# Patient Record
Sex: Female | Born: 1960 | Race: Black or African American | Hispanic: No | Marital: Married | Smoking: Former smoker
Health system: Southern US, Community
[De-identification: ages and names within clinical notes are randomized; demographics above are authoritative.]

## PROBLEM LIST (undated history)

## (undated) DIAGNOSIS — R7303 Prediabetes: Secondary | ICD-10-CM

## (undated) DIAGNOSIS — G43909 Migraine, unspecified, not intractable, without status migrainosus: Secondary | ICD-10-CM

## (undated) DIAGNOSIS — E785 Hyperlipidemia, unspecified: Secondary | ICD-10-CM

## (undated) DIAGNOSIS — E559 Vitamin D deficiency, unspecified: Secondary | ICD-10-CM

## (undated) DIAGNOSIS — E538 Deficiency of other specified B group vitamins: Secondary | ICD-10-CM

## (undated) HISTORY — DX: Deficiency of other specified B group vitamins: E53.8

## (undated) HISTORY — PX: TUBAL LIGATION: SHX77

## (undated) HISTORY — DX: Migraine, unspecified, not intractable, without status migrainosus: G43.909

## (undated) HISTORY — DX: Vitamin D deficiency, unspecified: E55.9

---

## 2012-04-06 ENCOUNTER — Encounter (INDEPENDENT_AMBULATORY_CARE_PROVIDER_SITE_OTHER): Payer: Self-pay

## 2012-04-09 ENCOUNTER — Encounter (INDEPENDENT_AMBULATORY_CARE_PROVIDER_SITE_OTHER): Payer: Self-pay | Admitting: Specialist

## 2012-04-09 ENCOUNTER — Ambulatory Visit (INDEPENDENT_AMBULATORY_CARE_PROVIDER_SITE_OTHER): Payer: BC Managed Care – PPO | Admitting: Specialist

## 2012-04-09 VITALS — BP 112/67 | HR 77 | Wt 126.0 lb

## 2012-04-09 MED ORDER — ZOLMITRIPTAN 5 MG NA SOLN
5.00 mg | NASAL | Status: DC | PRN
Start: 2012-04-09 — End: 2013-03-27

## 2012-04-09 NOTE — Progress Notes (Signed)
ASSESSMENT: PROGRESS NOTE    Patient Name: AudreyVang    CC: Chronic Headaches  Chief Complaint   Patient presents with   . Follow-up     PCP:    Patient presents for follow up of neurologic concerns. She reports migraine headaches triggered with weather changes, as well as erratic menses. Headaches escalated in January (7 days) but December was only 5 days, and she requires refills. She stopped magnesium and estro-balance caused flatulence. Previous trials topomax, neurontin, flexeril, and Inderal unhelpful. CGRP clinical trial was possibly helpful but she had erratic menses as well.           Review of Systems:     Neurological ROS: positive for - headaches  No chest pain, SOB, palpitations, or dizziness.      Physical Exam:  BP 112/67  Pulse 77  Wt 57.153 kg (126 lb)  Neurological - alert, oriented, normal speech, no focal findings or movement disorder noted, neck supple without rigidity, DTR's normal and symmetric, motor and sensory grossly normal bilaterally    Medications:     Current outpatient prescriptions:ZOLMitriptan (ZOMIG) 5 MG nasal solution, by Nasal route as needed., Disp: , Rfl: ;  SUMAtriptan Succinate (SUMAVEL DOSEPRO) 6 MG/0.5ML DEVI, Inject into the skin 2 (two) times daily., Disp: , Rfl:     Past Medical/Surgical History:     Allergies   Allergen Reactions   . Sulfa Antibiotics Swelling     Patient Active Problem List   Diagnosis   . Migraine without aura, with intractable migraine, so stated, without mention of status migrainosus     Past Medical History   Diagnosis Date   . Migraine      Past Surgical History   Procedure Date   . Tubal ligation      History     Social History   . Marital Status: Married     Spouse Name: N/A     Number of Children: N/A   . Years of Education: N/A     Occupational History   . Not on file.     Social History Main Topics   . Smoking status: Former Games developer   . Smokeless tobacco: Never Used   . Alcohol Use: 0.6 oz/week     1 Glasses of wine per week   . Drug Use:  No   . Sexually Active: Yes     Other Topics Concern   . Not on file     Social History Narrative   . No narrative on file         Assessment:     1. Migraine without aura, with intractable migraine, so stated, without mention of status migrainosus        Plan:   Rx refill for zomig NS provided. Reviewed possibility of trying nortriptylene but she would like to defer. She will rechallenge with magnesium oxide.       Total time spent face to face= 25 minutes with >15 minute for counseling and coordination of care of the above assessment, differential diagnosis, treatment options, and management of the above noted plan.       Signed by:  Lonell Face D.O.  Clinical cytogeneticist of Headache & Concussion  [employer]  [facility]

## 2012-04-16 ENCOUNTER — Telehealth (INDEPENDENT_AMBULATORY_CARE_PROVIDER_SITE_OTHER): Payer: Self-pay

## 2012-04-16 NOTE — Telephone Encounter (Signed)
PreAuth was done for Pt, Zomig 5mg . NS its approved and pt was notified.

## 2013-03-27 ENCOUNTER — Ambulatory Visit (INDEPENDENT_AMBULATORY_CARE_PROVIDER_SITE_OTHER): Payer: BC Managed Care – PPO | Admitting: Specialist

## 2013-03-27 ENCOUNTER — Encounter (INDEPENDENT_AMBULATORY_CARE_PROVIDER_SITE_OTHER): Payer: Self-pay | Admitting: Specialist

## 2013-03-27 VITALS — BP 124/78 | HR 82 | Resp 18 | Ht 60.0 in | Wt 125.0 lb

## 2013-03-27 DIAGNOSIS — G43019 Migraine without aura, intractable, without status migrainosus: Secondary | ICD-10-CM

## 2013-03-27 MED ORDER — ZOLMITRIPTAN 5 MG NA SOLN
5.00 mg | NASAL | Status: DC | PRN
Start: 2013-03-27 — End: 2013-05-02

## 2013-03-27 NOTE — Progress Notes (Signed)
Patient Name: [name] [name],[name] [name]    CC: Chronic Headaches, Post-menopausal state  Chief Complaint   Patient presents with   . Follow-up     History of Present Illness:   She had previously magnesium and estro-balance because it caused flatulence but resuming it has been unhelpful. Previous trials topomax, neurontin, flexeril, and Inderal unhelpful. CGRP clinical trial was possibly helpful but she had erratic menses as well. Currently no further menses after D/C and avg headache frequency about 5-8 days per month triggered likely with weather changes. Zomig very helpful but headache recurs and clusters frequently. The location of pain is bifrontal and in posterior occiput, and midline. No suffering if treated. No nausea but occasionally some GI upset.     Review of Systems:     Neurological ROS: positive for - headaches  All other systems were reviewed and are negative    Physical Exam:  BP 124/78  Pulse 82  Resp 18  Ht 1.524 m (5')  Wt 56.7 kg (125 lb)  BMI 24.41 kg/m2    Well nourished and well developed,  Skin warm and well perfused,No rash noted,  Normal dentition, Oropharynx inspection normal,  No Carotid Bruit  Heart regular rate and rhythm without murmur,    Patient is AAO x 3, Normal cognition,   CN II-XII intact ,PERRLA and NL fundoscopy,   NL motor and sensory assessments,  NL DTR's, NL Gait,    Musculoskeletal exam reveals no tenderness on palpation of greater occipital nerves,  No noted upper cervical muscle spasms.  No evidence for atlas asymmetry on palpation.    Medications:     Current outpatient prescriptions:nitrofurantoin, macrocrystal-monohydrate, (MACROBID) 100 MG capsule, Take 100 mg by mouth 2 (two) times daily., Disp: , Rfl: ;  ZOLMitriptan (ZOMIG) 5 MG nasal solution, 1 spray by Nasal route as needed., Disp: 12 mL, Rfl: 11    Past Medical/Surgical History:     Allergies   Allergen Reactions   . Sulfa Antibiotics Swelling     Patient Active Problem List   Diagnosis   . Migraine without aura,  with intractable migraine, so stated, without mention of status migrainosus     Past Medical History   Diagnosis Date   . Migraine      Past Surgical History   Procedure Date   . Tubal ligation      History     Social History   . Marital Status: Married     Spouse Name: N/A     Number of Children: N/A   . Years of Education: N/A     Occupational History   . Not on file.     Social History Main Topics   . Smoking status: Former Games developer   . Smokeless tobacco: Never Used   . Alcohol Use: 0.6 oz/week     1 Glasses of wine per week   . Drug Use: No   . Sexually Active: Yes     Other Topics Concern   . Not on file     Social History Narrative   . No narrative on file         Family History:     Family History   Problem Relation Age of Onset   . Arthritis Mother    . Cancer Father    . Cancer Maternal Grandfather        Assessment:     1. Migraine without aura, with intractable migraine, so stated, without mention of status migrainosus  2. Post-menopausal state.     Plan:     Rx refill for zomig provided . She will avoid repetitive dosing to prevent rebound. She will try addition of melatonin in interim if sleep not optimal.       Total time spent face to face= 25 minutes with >15 minute for counseling and coordination of care of the above assessment, differential diagnosis, treatment options, and management of the above noted plan.     Signed by:  Lonell Face D.O., F.A.H.S.  Clinical cytogeneticist of Headache & Concussion  [facility]  [facility]   [phone]

## 2013-05-02 ENCOUNTER — Other Ambulatory Visit (INDEPENDENT_AMBULATORY_CARE_PROVIDER_SITE_OTHER): Payer: Self-pay

## 2013-05-02 MED ORDER — ZOLMITRIPTAN 5 MG NA SOLN
5.0000 mg | NASAL | Status: DC | PRN
Start: 2013-05-02 — End: 2014-04-28

## 2014-04-28 ENCOUNTER — Encounter (INDEPENDENT_AMBULATORY_CARE_PROVIDER_SITE_OTHER): Payer: Self-pay | Admitting: Physician Assistant

## 2014-04-28 ENCOUNTER — Ambulatory Visit (INDEPENDENT_AMBULATORY_CARE_PROVIDER_SITE_OTHER): Payer: BC Managed Care – PPO | Admitting: Physician Assistant

## 2014-04-28 VITALS — BP 130/79 | HR 84 | Ht 60.0 in | Wt 127.0 lb

## 2014-04-28 DIAGNOSIS — R51 Headache: Secondary | ICD-10-CM

## 2014-04-28 DIAGNOSIS — F32A Depression, unspecified: Secondary | ICD-10-CM

## 2014-04-28 DIAGNOSIS — F329 Major depressive disorder, single episode, unspecified: Secondary | ICD-10-CM

## 2014-04-28 DIAGNOSIS — R519 Headache, unspecified: Secondary | ICD-10-CM

## 2014-04-28 MED ORDER — ZOLMITRIPTAN 5 MG NA SOLN
5.0000 mg | NASAL | Status: DC | PRN
Start: 2014-04-28 — End: 2015-04-29

## 2014-04-28 NOTE — Progress Notes (Signed)
Have you sought care outside of [facility] since we last saw you?   Yes - GYN

## 2014-04-28 NOTE — Progress Notes (Signed)
Still with weather changes as a major trigger. Zomig NS helps abort the pain completely, however can cause some burning. She is premenopausal and finds that the headaches have improved. 5-8 headache a month. She continues to get mild upset stomach symptoms with her headaches, and light/sound sensitivity and sinus symptoms, ear pain with her headache pain. Lately they have been localizing to the middle of her brows, and behind her left ear. No longer getting them at the base of her skull.     Sleep: Finds issues with both sleep onset and maintenance. It takes 30+ minutes to fall asleep, and waking up throughout the night due to hot flashes. She restarted Melatonin 4mg  and now started Zzzquil without much improvement.     Mood: Depressed, undure if this is seasonal or due to menopause. She finds winter time to be more depressing for her.           Allergies   Allergen Reactions   . Sulfa Antibiotics Swelling     Current Outpatient Prescriptions on File Prior to Visit   Medication Sig Dispense Refill   . nitrofurantoin, macrocrystal-monohydrate, (MACROBID) 100 MG capsule Take 100 mg by mouth 2 (two) times daily.     Marland Kitchen ZOLMitriptan (ZOMIG) 5 MG nasal solution 1 spray by Nasal route every 2 (two) hours as needed. 36 mL 3     No current facility-administered medications on file prior to visit.         Review of Systems :  Review of Systems   Constitutional: Negative for weight loss, malaise/fatigue and diaphoresis.   HENT: Negative for congestion and nosebleeds.    Eyes: Positive for photophobia.   Respiratory: Negative for hemoptysis, sputum production, shortness of breath and stridor.    Cardiovascular: Negative for orthopnea, claudication and leg swelling.   Gastrointestinal: Positive for nausea.   Genitourinary: Negative for urgency, frequency and hematuria.   Musculoskeletal: Negative for back pain, joint pain and neck pain.   Neurological: Positive for headaches. Negative for speech change, focal weakness and  seizures.   Psychiatric/Behavioral: Positive for depression. The patient has insomnia.            Physical Examination:   Filed Vitals:    04/28/14 0913   BP: 130/79   Pulse: 84       The patient was well developed, well nourished with normal hydration, and was cooperative, calm, followed commands and had a normal affect.    The patient was oriented to person, place, and time. Recent and remote memory were normal. Attention span, concentration, and language function were normal. There was no evidence of aphasia.      Fund of knowledge confirms that patient is aware of the current president and is able to spell CAT.     Cranial nerves:  Pupils are equal, round, regular and react to light;  III, IV, and VI revealed no abnormality. The patient had full extraocular movements. There was no nystagmus. The seventh cranial nerve function appeared normal. No facial weakness or asymmetry was noted. No evidence of central or peripheral seventh nerve palsy. In conversational speech, the patient's hearing appeared normal.     The patient moves all four extremities equally. Motor strength is 5/5 in bilat UE and LE.     There was no tremor, no spontaneous abnormal movements.  Coordination revealed no abnormality.     Station and gait were normal. Arm swing was symmetric. No balance abnormality.  Assessment:  Patient Active Problem List   Diagnosis   . Migraine without aura, with intractable migraine, so stated, without mention of status migrainosus         Plan:  [name] [name] is a 54 y.o. female with a Past Medical History of Chronic Episodic Migraines  -Continue Zomig PRN  -Discussed possible ani-depressants, she would like to hold off at the moment, will discuss her issues with GYN see if they are hormonal related and then get back to [place]  -Discussed possible prescription sleep agents, she would like to increase Melatonin at this time and will see if her sleep improves, if not then she will consider rx strength  medication.  -Follow up in 1 year or sooner PRN

## 2014-10-18 ENCOUNTER — Emergency Department (HOSPITAL_COMMUNITY)
Admission: EM | Admit: 2014-10-18 | Discharge: 2014-10-18 | Disposition: A | Discharge status: Home / Self Care | Payer: BLUE CROSS/BLUE SHIELD | Attending: Emergency Medicine | Admitting: Emergency Medicine

## 2014-10-18 ENCOUNTER — Emergency Department (HOSPITAL_COMMUNITY): Payer: BLUE CROSS/BLUE SHIELD

## 2014-10-18 ENCOUNTER — Encounter (HOSPITAL_COMMUNITY): Payer: Self-pay

## 2014-10-18 DIAGNOSIS — G43909 Migraine, unspecified, not intractable, without status migrainosus: Secondary | ICD-10-CM | POA: Insufficient documentation

## 2014-10-18 DIAGNOSIS — R0602 Shortness of breath: Secondary | ICD-10-CM | POA: Insufficient documentation

## 2014-10-18 DIAGNOSIS — Z87891 Personal history of nicotine dependence: Secondary | ICD-10-CM | POA: Diagnosis not present

## 2014-10-18 DIAGNOSIS — R202 Paresthesia of skin: Secondary | ICD-10-CM | POA: Insufficient documentation

## 2014-10-18 DIAGNOSIS — R531 Weakness: Secondary | ICD-10-CM | POA: Diagnosis present

## 2014-10-18 DIAGNOSIS — Z8639 Personal history of other endocrine, nutritional and metabolic disease: Secondary | ICD-10-CM | POA: Diagnosis not present

## 2014-10-18 DIAGNOSIS — R109 Unspecified abdominal pain: Secondary | ICD-10-CM | POA: Diagnosis not present

## 2014-10-18 DIAGNOSIS — R002 Palpitations: Secondary | ICD-10-CM | POA: Insufficient documentation

## 2014-10-18 HISTORY — DX: Prediabetes: R73.03

## 2014-10-18 HISTORY — DX: Migraine, unspecified, not intractable, without status migrainosus: G43.909

## 2014-10-18 HISTORY — DX: Hyperlipidemia, unspecified: E78.5

## 2014-10-18 LAB — I-STAT CHEM 8, ED
BUN: 19 mg/dL (ref 6–20)
CALCIUM ION: 1.22 mmol/L (ref 1.12–1.23)
Chloride: 106 mmol/L (ref 101–111)
Creatinine, Ser: 0.8 mg/dL (ref 0.44–1.00)
GLUCOSE: 86 mg/dL (ref 65–99)
HCT: 42 % (ref 36.0–46.0)
HEMOGLOBIN: 14.3 g/dL (ref 12.0–15.0)
Potassium: 4.1 mmol/L (ref 3.5–5.1)
Sodium: 143 mmol/L (ref 135–145)
TCO2: 24 mmol/L (ref 0–100)

## 2014-10-18 LAB — I-STAT TROPONIN, ED: Troponin i, poc: 0 ng/mL (ref 0.00–0.08)

## 2014-10-18 NOTE — ED Notes (Signed)
Pt reports she had a migraine last night and took her prescription meds. Pt then noticed heart was "racing." Pt denies chest pain. Pt also reports left side weakness which is common for her after a migraine.

## 2014-10-18 NOTE — ED Provider Notes (Signed)
CSN: [id number]     Arrival date & time 10/18/14  0746 History   First MD Initiated Contact with Patient 10/18/14 0759     Chief Complaint  Patient presents with  . Palpitations  . Extremity Weakness     (Consider location/radiation/quality/duration/timing/severity/associated sxs/prior Treatment) HPI   54 y.o female complaining of migraine headache begasn last night with left sided h/a symptoms which are improved after using zomig this am although some slight residual h/a.   This was like her usual migraine.  Began last night.  Arm felt weak this am which has been part of her migraines in the past.  She states she was concerned due to palpitations which she describes as pounding in the chest but no pain.  She was very active moving daughter in to aartment yesterday.  Palpitatiojs are currently mild.   Past Medical History  Diagnosis Date  . Migraines   . Pre-diabetes   . Hyperlipidemia    Past Surgical History  Procedure Laterality Date  . Tubal ligation     History reviewed. No pertinent family history. Social History  Substance Use Topics  . Smoking status: Former Smoker    Quit date: 03/08/1987  . Smokeless tobacco: None  . Alcohol Use: Yes     Comment: Occasionally    OB History    No data available     Review of Systems  Constitutional: Positive for activity change.  Eyes: Negative.   Respiratory: Positive for shortness of breath.   Cardiovascular: Positive for palpitations.  Gastrointestinal:       Abdominal cramping resolved with bm  Endocrine: Negative.   Genitourinary: Negative.   Musculoskeletal: Negative.   Skin: Negative.   Allergic/Immunologic: Negative.   Neurological: Positive for weakness.  Hematological: Negative.   Psychiatric/Behavioral: Negative.   All other systems reviewed and are negative.     Allergies  Sulfa antibiotics  Home Medications   Prior to Admission medications   Not on File   BP 122/68 mmHg  Pulse 71  Temp(Src)  97.9 F (36.6 C) (Oral)  Resp 14  Ht 5' (1.524 m)  Wt 125 lb (56.7 kg)  BMI 24.41 kg/m2  SpO2 100% Physical Exam  Constitutional: She is oriented to person, place, and time. She appears well-developed and well-nourished. No distress.  HENT:  Head: Normocephalic and atraumatic.  Right Ear: External ear normal.  Left Ear: External ear normal.  Nose: Nose normal.  Mouth/Throat: Oropharynx is clear and moist.  Eyes: Conjunctivae and EOM are normal. Pupils are equal, round, and reactive to light.  Neck: Normal range of motion. Neck supple.  Cardiovascular: Normal rate, regular rhythm, normal heart sounds and intact distal pulses.   Pulmonary/Chest: Effort normal and breath sounds normal.  Abdominal: Soft. Bowel sounds are normal.  Musculoskeletal: Normal range of motion.  Neurological: She is alert and oriented to person, place, and time. She has normal reflexes. She displays normal reflexes. No cranial nerve deficit. She exhibits normal muscle tone. Coordination normal.  Skin: Skin is warm and dry.  Psychiatric: She has a normal mood and affect. Her behavior is normal. Judgment and thought content normal.  Nursing note and vitals reviewed.   ED Course  Procedures (including critical care time) Labs Review Labs Reviewed  I-STAT CHEM 8, ED  Rosezena Sensor, ED    Imaging Review Dg Chest 2 View  10/18/2014   CLINICAL DATA:  Palpitations, dyspnea and headache.  EXAM: CHEST - 2 VIEW  COMPARISON:  None  FINDINGS: The  heart size and mediastinal contours are within normal limits. There is no evidence of pulmonary edema, consolidation, pneumothorax, nodule or pleural fluid. The visualized skeletal structures are unremarkable.  IMPRESSION: No active disease.   Electronically Signed   By: Irish Lack M.D.   On: 10/18/2014 09:06   Ct Head Wo Contrast  10/18/2014   CLINICAL DATA:  Left arm weakness, migraine headache.  EXAM: CT HEAD WITHOUT CONTRAST  TECHNIQUE: Contiguous axial images  were obtained from the base of the skull through the vertex without intravenous contrast.  COMPARISON:  None.  FINDINGS: Bony calvarium appears intact. No mass effect or midline shift is noted. Ventricular size is within normal limits. There is no evidence of mass lesion, hemorrhage or acute infarction.  IMPRESSION: Normal head CT.   Electronically Signed   By: Lupita Raider, M.D.   On: 10/18/2014 09:06   I, Sherolyn Trettin S, personally reviewed and evaluated these images and lab results as part of my medical decision-making.   EKG Interpretation   Date/Time:  Saturday October 18 2014 08:04:35 EDT Ventricular Rate:  77 PR Interval:  155 QRS Duration: 87 QT Interval:  369 QTC Calculation: 418 R Axis:   53 Text Interpretation:  Normal sinus rhythm Normal ECG Confirmed by Josyah Achor MD,  Duwayne Heck ([id number]) on 10/18/2014 8:29:01 AM      MDM   Final diagnoses:  Migraine without status migrainosus, not intractable, unspecified migraine type  Paresthesia of left arm  Palpitations        Margarita Grizzle, MD 10/18/14 1155

## 2014-10-18 NOTE — Discharge Instructions (Signed)
Your evaluation here including physical exam, EKG, and labs was normal. Please follow-up with your primary care doctor this week. Return here if you are worse at any time and we will reevaluate you.   Palpitations A palpitation is the feeling that your heartbeat is irregular. It may feel like your heart is fluttering or skipping a beat. It may also feel like your heart is beating faster than normal. This is usually not a serious problem. In some cases, you may need more medical tests. HOME CARE  Avoid:  Caffeine in coffee, tea, soft drinks, diet pills, and energy drinks.  Chocolate.  Alcohol.  Stop smoking if you smoke.  Reduce your stress and anxiety. Try:  A method that measures bodily functions so you can learn to control them (biofeedback).  Yoga.  Meditation.  Physical activity such as swimming, jogging, or walking.  Get plenty of rest and sleep. GET HELP IF:  Your fast or irregular heartbeat continues after 24 hours.  Your palpitations occur more often. GET HELP RIGHT AWAY IF:   You have chest pain.  You feel short of breath.  You have a very bad headache.  You feel dizzy or pass out (faint). MAKE SURE YOU:   Understand these instructions.  Will watch your condition.  Will get help right away if you are not doing well or get worse. Document Released: 12/01/2007 Document Revised: 07/08/2013 Document Reviewed: 04/22/2011 [facility] Patient Information 2015 [place], [place]. This information is not intended to replace advice given to you by your health care provider. Make sure you discuss any questions you have with your health care provider.

## 2015-04-29 ENCOUNTER — Ambulatory Visit (INDEPENDENT_AMBULATORY_CARE_PROVIDER_SITE_OTHER): Payer: BC Managed Care – PPO | Admitting: Medical

## 2015-04-29 ENCOUNTER — Encounter (INDEPENDENT_AMBULATORY_CARE_PROVIDER_SITE_OTHER): Payer: Self-pay | Admitting: Acute Care

## 2015-04-29 ENCOUNTER — Ambulatory Visit (INDEPENDENT_AMBULATORY_CARE_PROVIDER_SITE_OTHER): Payer: BC Managed Care – PPO | Admitting: Acute Care

## 2015-04-29 VITALS — BP 131/83 | HR 71 | Ht 60.0 in | Wt 123.0 lb

## 2015-04-29 DIAGNOSIS — G43809 Other migraine, not intractable, without status migrainosus: Secondary | ICD-10-CM

## 2015-04-29 MED ORDER — NARATRIPTAN HCL 2.5 MG PO TABS
2.5000 mg | ORAL_TABLET | ORAL | Status: DC | PRN
Start: 2015-04-29 — End: 2015-09-24

## 2015-04-29 NOTE — Progress Notes (Signed)
S:  [name] [name] is a 55 y.o. female presents for follow up.  Migraine, 6 per month.  Zomig NS works for relief, however she experiences terrible side effects so she would like to try a new Triptan.  Left sided, throbbing/pulsating, worse with activity.  Retro-orbital, behind her ear, wrapping around to the back of her head in a band-like formation.  Phono/photobia, some upset stomach, no vomiting.    Sleeping well with Melatonin.    Previous trials topomax, neurontin, flexeril, and Inderal, all ineffective.    ROS:  Headaches  All other systems reviewed and are negative.    O: BP 131/83 mmHg  Pulse 71  Ht 1.524 m (5')  Wt 55.792 kg (123 lb)  BMI 24.02 kg/m2    Awake, alert, oriented x 3, speech clear and fluent  PERRL, EOMI, FS, TML  Following commands  Hearing intact  No drift  Finger to nose normal  No tremors  Strength 5/5 throughout  Reflexes 2+ throughout  Sensation intact to light touch  Station normal, Gait stable    A:   Patient Active Problem List    Diagnosis Date Noted   . Migraine without aura, with intractable migraine, so stated, without mention of status migrainosus 04/09/2012       P:  Amerge for rescue.  Stop Zomig NS.  Plain Mucinex for stuffy nose/cold symptoms.    Consider preventative at next OV.    Start Magnesium Oxide 400mg , Riboflavin (Vitamin B2) 400mg  and Coenzyme Q10 300mg  daily for headache prevention     Follow up:  [place] 3 months.    Discussed the risks associated with long-term use of controlled substances/sedating/narcotic/opioid medication.  Discussed the dangers of withdrawal, and that if withdrawals occur to proceed to the closest ER or call 911. Discussed the importance of using the medication only as prescribed, for if the patient runs out of medication early, no early fills will be issued.  No new prescriptions will be reissued for lost or stolen prescriptions.    Patient advised that prescriptions will be filled at appointments only and not routinely in between  appointments.  Patient verbalized understanding.    The patient is in agreement with this plan and verbalized understanding of these instructions.    Kelvin Cellar, NP

## 2015-04-29 NOTE — Patient Instructions (Signed)
Start Magnesium Oxide 400mg , Riboflavin (Vitamin B2) 400mg  and Coenzyme Q10 300mg  daily for headache prevention

## 2015-05-28 ENCOUNTER — Ambulatory Visit (INDEPENDENT_AMBULATORY_CARE_PROVIDER_SITE_OTHER): Payer: BC Managed Care – PPO | Admitting: Neurology

## 2015-05-28 VITALS — BP 130/79 | HR 89 | Ht 60.0 in | Wt 124.0 lb

## 2015-05-28 DIAGNOSIS — G43019 Migraine without aura, intractable, without status migrainosus: Secondary | ICD-10-CM

## 2015-05-28 NOTE — Progress Notes (Signed)
[facility] Established Follow up     55 y.o. right handed handed female presents with headaches, former Dr. Renold Don patient, last seen on 04/28/14 by  for migraine headaches. Medical chart reviewed. [name] [name] began at age 40. HA start on posterior neck or behind either the right or left forehead and radiate to temple and neck. The headaches are described as sometimes dull, sometimes sharp, sometimes throbbing. There is no aura.    HA have improved since menopause  Is now on average having 6 HA days per month on average, sometimes headaches improve with aleve, Last 1 - 2 hours,       Associated symptoms: Has photo/phono/osmo phobia, no nausea, no vomiting, blurry vision, fatigue  Autonomic symptoms: Denies ptosis, conjunctival injection, lacrimation, rhinorrhea, congestion, perioral numbness, pallor   Neurological symptoms: Denies focal numbness, weakness, vertigo, lightheadedness, diplopia, dysarthria or aphasia     Triggers: weather, wine  Headaches are treated with zomig, tried amerge recently     Prior work-up:  MRI in the past were negative     Prior meds:  Topamax  Gabapentin  Propranolol     Flexeril  Zomig  Imitrex    Past Medical History   Diagnosis Date   . Migraine        Current outpatient prescriptions:   .  co-enzyme Q-10 50 MG capsule, Take 100 mg by mouth daily., Disp: , Rfl:   .  naratriptan (AMERGE) 2.5 MG tablet, Take 1 tablet (2.5 mg total) by mouth as needed for Migraine (Repeat in 4 hours if needed.  Max 2 in 24 hours.)., Disp: 12 tablet, Rfl: 5  Allergies   Allergen Reactions   . Sulfa Antibiotics Swelling     Social History     Social History   . Marital Status: Married     Spouse Name: N/A   . Number of Children: N/A   . Years of Education: N/A     Occupational History   . Not on file.     Social History Main Topics   . Smoking status: Former Games developer   . Smokeless tobacco: Never Used   . Alcohol Use: 0.6 oz/week     1 Glasses of wine per week   . Drug Use: No   . Sexual Activity: Yes      Other Topics Concern   . Not on file     Social History Narrative     Family History   Problem Relation Age of Onset   . Arthritis Mother    . Cancer Father    . Cancer Maternal Grandfather        Lifestyle: has not been exercising, sleeps 6 hours with a regular schedule, eats breakfast, drinks 4 cups of fluids and 1 cups of caffeine    Review of Systems:  General: patient denies fever, chills    Head and neck: patient denies sore throat, blurry vision  Pulmonary: patient denies cough, SOB  Cardiovascular/cholesterol: patient denies chest pain, palpitations   Neurologic: See HPI   GI: patient denies nausea, vomiting, diarrhea or constipation  GU: patient denies dysuria  Endocrine: patient denies heat/cold intolerance   Rheum: patient denies joint pain  Heme/Onc: patient denies easy bruising  Derm/bone: patient denies rashes  Psychiatric: patient denies anxiety or depression     PE:  Filed Vitals:    05/28/15 1014   BP: 130/79   Pulse: 89   Height: 1.524 m (5')   Weight: 56.246 kg (124 lb)  HEENT: NCAT  Heart: RRR, no bruits  Resp: CTAB  Abd: NTND  Neuro:  Mental status: normal cognition, fluent, psychological good affect  Cranial nerves: PERRL, EOMI, VFF, sens intact to LT, face symmetric,  hearing intact to voice, tongue/uvula/palate midline, good shoulder shrug and scm strength  Motor: no pronator drift, normal tone and bulk, 5/5  Sensory: intact to light touch on all 4 extremities   DTR: 2+ throughout  Coor: intact FTN and HTS   Gait: narrow, normal stance, good arm swing     Impression:  55 y.o. female with   1. Intractable migraine without aura and without status migrainosus       - She is having on average 6 headache days per month, explained at length that not starting prevention places her at increased risk for chronic migraine  - Ok to hold off on prevention while she works on lifestyle changes and takes supplements on a daily basis  - If on follow up visit in 3 months has more than 4 HA days per  month, will plan to start candesartan  - Continue amerge for abortive, counseled to limit use to 2 days/week  - continue Magnesium 400mg , Riboflavin (Vitamin B2) 400mg  and Coenzyme Q10 300mg  daily for headache prevention     Lifestyle modifications: increase exercise, keep a regular sleep schedule, have breakfast with protein, increase hydration;  - RTC in 3 months with NP Morris, subsequent appointment with MD    Greater than 23 minutes of this 45 minute visit was spent on education and counseling regarding diagnosis and treatment including lifestyle modifications, proper use of preventive and abortive medication and avoidance of medication overuse.

## 2015-05-28 NOTE — Patient Instructions (Signed)
1. Instructions were provided regarding your diagnosis and treatment plan.     2. Lifestyle modifications were reviewed: exercise regularly, keep a regular sleep schedule, eat breakfast with protein, drink plenty of fluids.     3. Remember to limit acute headache treatment medicines (like triptans or anti-inflammatories) to 2 days/week to avoid medication-overuse headache.    For migraine education resources:    American Headache Society: https://americanmigrainefoundation.org  American Migraine Foundation: https://americanmigrainefoundation.org    Start Magnesium citrate or glycinate 400mg , Riboflavin (Vitamin B2) 400mg  and Coenzyme Q10 300mg  daily for headache prevention

## 2015-08-12 ENCOUNTER — Ambulatory Visit (INDEPENDENT_AMBULATORY_CARE_PROVIDER_SITE_OTHER): Payer: BC Managed Care – PPO | Admitting: Neurology

## 2015-09-24 ENCOUNTER — Ambulatory Visit (INDEPENDENT_AMBULATORY_CARE_PROVIDER_SITE_OTHER): Payer: BC Managed Care – PPO | Admitting: Neurology

## 2015-09-24 VITALS — BP 118/73 | HR 86 | Resp 18 | Ht 60.0 in | Wt 124.0 lb

## 2015-09-24 DIAGNOSIS — G43019 Migraine without aura, intractable, without status migrainosus: Secondary | ICD-10-CM

## 2015-09-24 MED ORDER — NARATRIPTAN HCL 2.5 MG PO TABS
2.5000 mg | ORAL_TABLET | ORAL | Status: AC | PRN
Start: 2015-09-24 — End: 2015-10-24

## 2015-09-24 NOTE — Progress Notes (Signed)
[name] [name],[name] [name]  02-Jan-1961  [id number]    [facility] Follow UP    55 y.o. female last seen on 05/28/15 for migraine headaches.    Has been treating headaches with Aleve     Has not needed a triptan since June 11  In May had 6 migraines   Now headaches are milder     Started supplements and noticed headaches are improved.     HA start on posterior neck or behind either the right or left forehead and radiate to temple and neck. The headaches are described as sometimes dull, sometimes sharp, sometimes throbbing. Associated photo/phono/osmo phobia, blurry vision, fatigue      Current outpatient prescriptions:   .  co-enzyme Q-10 50 MG capsule, Take 100 mg by mouth daily., Disp: , Rfl:   .  Cyanocobalamin (VITAMIN B 12 PO), Take by mouth., Disp: , Rfl:   .  ESTRACE VAGINAL 0.1 MG/GM vaginal cream, 1 GRAM TWICE WEEKLY AND SMALL AMOUNT WITH FINGER AROUND OPENING FOR 4 WEEKS, Disp: , Rfl: 3  .  Magnesium 500 MG Tab, Take 500 mg by mouth., Disp: , Rfl:   .  naratriptan (AMERGE) 2.5 MG tablet, Take 1 tablet (2.5 mg total) by mouth as needed for Migraine.Max 2 in 24 hours. Limit use to 2 days per week., Disp: 12 tablet, Rfl: 6      PRIOR MEDS:  Topamax  Gabapentin  Propranolol   Flexeril  Zomig  Imitrex    ROS:  exercise 3-4 days, sleep 5 - 6 hours, eats breakfast, drinks 4 cups of fluids   Negative for visual changes, rash, bleeding abnormalities, cardiac, GI, respiratory issues    PE:  Filed Vitals:    09/24/15 1535   BP: 118/73   Pulse: 86   Resp: 18   Height: 1.524 m (5')   Weight: 56.246 kg (124 lb)       GEN: no acute distress  CV: RR  MS: fluent with no aphasia   CN: PERRL, EOMI, VFF, face symmetric, T midline  M: no PND, normal tone, 5/5 throughout   Gait: narrow with good armswing    IMP:  55 y.o.female with   1. Intractable migraine without aura and without status migrainosus       - Has had significant decrease in HA frequency in June  - Continue supplements  - Continue amerge for rescue    - Lifestyle:  continue exercise, keep a regular sleep schedule, eat breakfast with protein, increase hydration  - RTC in 4 months     Greater than 15 minutes of this 30 minute encounter was spent on education and counseling regarding diagnosis and treatment with the patient  including lifestyle modifications, proper use of preventive and abortive medication and avoidance of medication overuse.

## 2015-09-24 NOTE — Patient Instructions (Signed)
1. Instructions were provided regarding your diagnosis and treatment plan.     2. Lifestyle modifications were reviewed: exercise regularly, keep a regular sleep schedule, eat breakfast with protein, drink plenty of fluids.     3. Remember to limit acute headache treatment medicines (like triptans or anti-inflammatories) to 2 days/week to avoid medication-overuse headache.    For migraine education resources:    American Headache Society: https://americanheadachesociety.org  American Migraine Foundation: https://americanmigrainefoundation.org

## 2015-12-28 ENCOUNTER — Ambulatory Visit (INDEPENDENT_AMBULATORY_CARE_PROVIDER_SITE_OTHER): Payer: BC Managed Care – PPO | Admitting: Neurology

## 2015-12-28 ENCOUNTER — Encounter (INDEPENDENT_AMBULATORY_CARE_PROVIDER_SITE_OTHER): Payer: Self-pay

## 2015-12-28 ENCOUNTER — Encounter (INDEPENDENT_AMBULATORY_CARE_PROVIDER_SITE_OTHER): Payer: Self-pay | Admitting: Neurology

## 2015-12-28 VITALS — BP 112/73 | HR 79 | Ht 60.0 in | Wt 124.0 lb

## 2015-12-28 DIAGNOSIS — M5410 Radiculopathy, site unspecified: Secondary | ICD-10-CM

## 2015-12-28 DIAGNOSIS — M792 Neuralgia and neuritis, unspecified: Secondary | ICD-10-CM

## 2015-12-28 DIAGNOSIS — G6289 Other specified polyneuropathies: Secondary | ICD-10-CM

## 2015-12-28 DIAGNOSIS — G43109 Migraine with aura, not intractable, without status migrainosus: Secondary | ICD-10-CM

## 2015-12-28 MED ORDER — GABAPENTIN 300 MG PO CAPS
300.0000 mg | ORAL_CAPSULE | Freq: Every evening | ORAL | 11 refills | Status: DC | PRN
Start: 2015-12-28 — End: 2016-11-14

## 2015-12-28 NOTE — Patient Instructions (Signed)
MRI:   - [facility]  - [facility]  - [facility] - [address], [place], [place]    EMG/NCS - [employer] - Any [facility] facility

## 2015-12-28 NOTE — Progress Notes (Signed)
CC: neuropathic pain    HPI:   History was obtained from patient,    55 y.o. year-old female who sees Dr Arturo Morton for migraines. Migraines occur roughly 4-5x a month. She is not on a migraine preventive, declining it. Takes migraine preventive vitamins. They are associated with photophobia, photopsias, and occasional tingling.     She is seeing me because she is experiencing bilateral burning leg pain that is progressively worsening. No clear exacerbating or alleviating factors. She also experiences neck pain radiating down her arms at time. She does not have any motor difficulties or falls. She has never had a workup for this pain syndrome. She has never had imaging done.     Previous records were reviewed and, in summary, reveal:   - migraines reducing in frequency post menopause. Rx'd only migraine vitamins which she takes.    Past Medical, Surgical, Social, Family History: Per Epic. Reviewed and updated  She works in Education officer, environmental, work involves lots of typing.    Review of Systems   Constitutional: Negative for fever.   HENT: Negative for hearing loss.    Eyes: Positive for photophobia.   Respiratory: Negative for shortness of breath.    Cardiovascular: Negative for chest pain.   Gastrointestinal: Negative for abdominal pain.   Genitourinary: Negative for urgency.   Musculoskeletal: Positive for back pain, myalgias and neck pain. Negative for falls and joint pain.   Skin: Negative for rash.   Neurological: Positive for tingling, sensory change and headaches. Negative for focal weakness, loss of consciousness and weakness.   Endo/Heme/Allergies: Does not bruise/bleed easily.   Psychiatric/Behavioral: Negative for substance abuse.   All other systems reviewed and are negative.      Meds:  Current Outpatient Prescriptions on File Prior to Visit   Medication Sig Dispense Refill   . co-enzyme Q-10 50 MG capsule Take 100 mg by mouth daily.     . Cyanocobalamin (VITAMIN B 12 PO) Take by mouth.     . ESTRACE VAGINAL 0.1  MG/GM vaginal cream 1 GRAM TWICE WEEKLY AND SMALL AMOUNT WITH FINGER AROUND OPENING FOR 4 WEEKS  3   . Magnesium 500 MG Tab Take 500 mg by mouth.       No current facility-administered medications on file prior to visit.        EXAM:  Vitals:    12/28/15 1014   BP: 112/73   Pulse: 79     Afebrile.  Respirations normal.    General: Well developed and well nourished in no acute distress. There is no icterus or cyanosis or pedal edema. Lungs are clear and heart sounds are normal. Peripheral pulses are intact.    Mental Status: The patient is oriented to person, place, and time. Attention and concentration are normal. There is no gross aphasia present. Recent and remote memory were normal. Speech is tangential.    Cranial Nerves:   I: deferred  II: Visual fields normal  III / VI / VI: Extraocular movements full.   V: Muscle of mastication symmetrically strong and sensation normal in all 3 divisions bilaterally to pinprick  VII: Symmetric face  VIII: Hearing intact to voice  IX, X: Phonation intact  XI: SCM/Traps intact  XII: Tongue midline    Motor: Power 5/5 throughout. normal tone and bulk.    Sensory: decreased pinprick in bilateral median n distributions as well as radial nerve distribution on right. Decreased pinprick in a length depedent manner in legs to 1/4th the way up the  shin. In addition, extra pinprick loss in bilateral great toes.    Reflexes: Reflexes are symmetrically 2+ in arms and 1+ patellars and absent achilles.    Cerebellar: no finger-nose, heel-toe or truncal ataxia noted. No tremors noted.     Gait / Station: Romberg negative. Gait revealed normal stance, stride, arm swing.     Imaging ordered    Labs ordered    Testing: Relevant neurodiagnostic testing orders    ASSESSMENT / PLAN  I spent 60 min of face-to-face time, of which 45 min was spent counseling. Issues addressed are noted in HPI section as well as below:  1. Migraines. Declning migraine preventive. Gabapentin will help with  migraines.    2. Neuropathic pain. Gbp 300mg  qhs PRN. Does not want a daily medication.    3. Peripheral neuropathy - labs ordered for etiologic workup    4. Multiple radiculopathies and/or mononeuropathies. Sensory > Motor. EMG/NCS to fully delineate the syndrome as it appears there are overlapping mechanisms.     1. Neuropathic pain  EMG    Nerve conduction test    MRI Cervical Spine WO Contrast    MRI Lumbar Spine WO Contrast    Nerve conduction test    ANA, BY MULTIPLEX FLOW IMMUNOASSAY WITH REFLEX TO TITER/CASCADE    Comprehensive metabolic panel    Ceruloplasmin    Copper, serum    T4, Total    TSH    Rheumatoid factor    Sedimentation rate (ESR)    C Reactive Protein    Hemoglobin A1C    Vitamin B12    Vitamin D,25 OH, Total    Heavy metals screen, urine    Zinc    Thyroid peroxidase antibody    Anti-Thyroid Antibodies    Thyroid stimulating immunoglobulin   2. Radiculopathy, unspecified spinal region  EMG    Nerve conduction test    MRI Cervical Spine WO Contrast    MRI Lumbar Spine WO Contrast    Nerve conduction test    ANA, BY MULTIPLEX FLOW IMMUNOASSAY WITH REFLEX TO TITER/CASCADE    Comprehensive metabolic panel    Ceruloplasmin    Copper, serum    T4, Total    TSH    Rheumatoid factor    Sedimentation rate (ESR)    C Reactive Protein    Hemoglobin A1C    Vitamin B12    Vitamin D,25 OH, Total    Heavy metals screen, urine    Zinc    Thyroid peroxidase antibody    Anti-Thyroid Antibodies    Thyroid stimulating immunoglobulin   3. Other polyneuropathy  EMG    Nerve conduction test    MRI Cervical Spine WO Contrast    MRI Lumbar Spine WO Contrast    Nerve conduction test    ANA, BY MULTIPLEX FLOW IMMUNOASSAY WITH REFLEX TO TITER/CASCADE    Comprehensive metabolic panel    Ceruloplasmin    Copper, serum    T4, Total    TSH    Rheumatoid factor    Sedimentation rate (ESR)    C Reactive Protein    Hemoglobin A1C    Vitamin B12    Vitamin D,25 OH, Total    Heavy metals screen, urine    Zinc    Thyroid  peroxidase antibody    Anti-Thyroid Antibodies    Thyroid stimulating immunoglobulin   4. Migraine with aura and without status migrainosus, not intractable  EMG    Nerve conduction test    MRI Cervical  Spine WO Contrast    MRI Lumbar Spine WO Contrast    Nerve conduction test    ANA, BY MULTIPLEX FLOW IMMUNOASSAY WITH REFLEX TO TITER/CASCADE    Comprehensive metabolic panel    Ceruloplasmin    Copper, serum    T4, Total    TSH    Rheumatoid factor    Sedimentation rate (ESR)    C Reactive Protein    Hemoglobin A1C    Vitamin B12    Vitamin D,25 OH, Total    Heavy metals screen, urine    Zinc    Thyroid peroxidase antibody    Anti-Thyroid Antibodies    Thyroid stimulating immunoglobulin       Orders Placed This Encounter   Procedures   . MRI Cervical Spine WO Contrast   . MRI Lumbar Spine WO Contrast   . ANA, BY MULTIPLEX FLOW IMMUNOASSAY WITH REFLEX TO TITER/CASCADE   . Comprehensive metabolic panel   . Ceruloplasmin   . Copper, serum   . T4, Total   . TSH   . Rheumatoid factor   . Sedimentation rate (ESR)   . C Reactive Protein   . Hemoglobin A1C   . Vitamin B12   . Vitamin D,25 OH, Total   . Heavy metals screen, urine   . Zinc   . Thyroid peroxidase antibody   . Anti-Thyroid Antibodies   . Thyroid stimulating immunoglobulin   . EMG   . Nerve conduction test   . Nerve conduction test       Yes if checked below:    _x__ This condition poses a threat to life, limb or cognitive function with a disease course that is modifiable with treatment.     Follow up in 1 month with me.    This patient is new to me    Please contact me with any questions. Patients and [facility] Providers can reach me via MyChart.    Dazia Lippold H. Theodoro Grist, MD, PhD  [facility]    [place]: [phone]  [place] [facility]: [phone]  [facility]: [phone]

## 2016-02-08 ENCOUNTER — Ambulatory Visit (INDEPENDENT_AMBULATORY_CARE_PROVIDER_SITE_OTHER): Payer: BC Managed Care – PPO | Admitting: Neurology

## 2016-03-10 ENCOUNTER — Encounter (INDEPENDENT_AMBULATORY_CARE_PROVIDER_SITE_OTHER): Payer: Self-pay | Admitting: Neurology

## 2016-03-27 ENCOUNTER — Other Ambulatory Visit (INDEPENDENT_AMBULATORY_CARE_PROVIDER_SITE_OTHER): Payer: Self-pay | Admitting: Acute Care

## 2016-04-14 ENCOUNTER — Encounter (INDEPENDENT_AMBULATORY_CARE_PROVIDER_SITE_OTHER): Payer: BC Managed Care – PPO | Admitting: Neurology

## 2016-04-14 NOTE — Progress Notes (Signed)
Patient late for appointment unable to be seen

## 2016-04-14 NOTE — Progress Notes (Deleted)
[name],[name]  09-13-1960  [id number]    [facility] Follow UP    56 y.o. female last seen on 09/24/15 for ***.      - Has had significant decrease in HA frequency in June  - Continue supplements  - Continue amerge for rescue    - Lifestyle: continue exercise, keep a regular sleep schedule, eat breakfast with protein, increase hydration  - RTC in 4 months     56 y.o. female last seen on 05/28/15 for migraine headaches.    Has been treating headaches with Aleve     Has not needed a triptan since June 11  In May had 6 migraines   Now headaches are milder     Started supplements and noticed headaches are improved.     HA start on posterior neck or behind either the right or left forehead and radiate to temple and neck. The headaches are described as sometimes dull, sometimes sharp, sometimes throbbing. Associated photo/phono/osmo phobia, blurry vision, fatigue        Current Outpatient Prescriptions:   .  co-enzyme Q-10 50 MG capsule, Take 100 mg by mouth daily., Disp: , Rfl:   .  Cyanocobalamin (VITAMIN B 12 PO), Take by mouth., Disp: , Rfl:   .  ESTRACE VAGINAL 0.1 MG/GM vaginal cream, 1 GRAM TWICE WEEKLY AND SMALL AMOUNT WITH FINGER AROUND OPENING FOR 4 WEEKS, Disp: , Rfl: 3  .  gabapentin (NEURONTIN) 300 MG capsule, Take 1 capsule (300 mg total) by mouth nightly as needed (tingling pain)., Disp: 30 capsule, Rfl: 11  .  Magnesium 500 MG Tab, Take 500 mg by mouth., Disp: , Rfl:       PRIOR MEDS:  Topamax  Gabapentin  Propranolol   Flexeril  Zomig  Imitrex      ROS:  exercise ***, sleep ***, *** breakfast, drinks *** cups of fluids   Negative for visual changes, rash, bleeding abnormalities, cardiac, GI, respiratory issues    PE:  There were no vitals filed for this visit.    GEN: no acute distress  CV: RRR  MS: fluent with no aphasia   CN: PERRL, disks sharp, EOMI, VFF, face symmetric, T midline  M: no PND, normal tone, 5/5 throughout   S: intact LT on all 4 ext  DTR: 2+ throughout   Coor: intact FNF  and HTS  Gait: narrow with good armswing    IMP:  56 y.o.female with No diagnosis found.    - Lifestyle: *** exercise, keep a regular sleep schedule, eat breakfast with protein, *** hydration  - RTC     Greater than 15 minutes of this 30 minute visit was spent on education and counseling regarding diagnosis of *** and treatment that includes medication and lifestyle modifications that include exercise 4 days per week, increase non-caffeinated fluid intake to 64 ounces per day, eat regular meals and breakfast with protein, limit caffeine intake to 200mg  per day and sleep 7-8 hour per night; proper use of the preventive medication which is *** and the abortive medication which is ***. Also limit use of rescue medications to 8 days per month to avoid medication overuse.

## 2016-04-20 ENCOUNTER — Ambulatory Visit (INDEPENDENT_AMBULATORY_CARE_PROVIDER_SITE_OTHER): Payer: BC Managed Care – PPO | Admitting: Neurology

## 2016-04-20 DIAGNOSIS — G43109 Migraine with aura, not intractable, without status migrainosus: Secondary | ICD-10-CM

## 2016-04-20 DIAGNOSIS — M5410 Radiculopathy, site unspecified: Secondary | ICD-10-CM

## 2016-04-20 DIAGNOSIS — G629 Polyneuropathy, unspecified: Secondary | ICD-10-CM

## 2016-04-20 DIAGNOSIS — G6289 Other specified polyneuropathies: Secondary | ICD-10-CM

## 2016-04-20 DIAGNOSIS — M792 Neuralgia and neuritis, unspecified: Secondary | ICD-10-CM

## 2016-04-20 NOTE — Procedures (Signed)
History:  This is a 56 year-old woman with symptoms of polyneuropathy    Physical Exam:  On examination, strength is full in the right/left/bilateral upper/lower/right/left extremities. Deep tendon reflexes are bilaterally symmetrical. Sensation is intact to touch, pinprick, except right lower extremity, decreased pinprick sensation     Findings:   1. The right/left median palmar, ulnar palmar, radial and sural sensory responses were unremarkable.  2. The right/left median and ulnar; common peroneal and tibial motor responses were unremarkable.   3. The right/left median and ulnar; common peroneal and tibial F-wave latencies were unremarkable.   4.   Needle electromyography of five muscles of the right/left upper/lower extremity was unremarkable.      Impression: This is a normal study. There is no electrodiagnostic evidence suggestive of a generalized large fiber neuropathy, myopathy, or right/left cervical/lumbosacral radiculopathy on this study.   Clinical and radiological correlation is recommended.however, small fiber polyneuropathy cannot be ruled out with normal EMG      Rosalio Macadamia, MD - [place]  NEUROLOGY  Available on [facility] paging  Board Certified in Neurology by ABPN  Board Certified Clinical Neurophysiology by ABPN     [address] [place]  [place] [id number]  T [phone] F [phone]     [email or web address]

## 2016-04-20 NOTE — Procedures (Signed)
No note

## 2016-04-20 NOTE — Procedures (Signed)
EMG/NCV studies are complete  Studies were scanned    Filed NCV charges

## 2016-04-25 ENCOUNTER — Ambulatory Visit
Admission: RE | Admit: 2016-04-25 | Discharge: 2016-04-25 | Disposition: A | Discharge status: Home / Self Care | Payer: BC Managed Care – PPO | Source: Ambulatory Visit | Attending: Neurology | Admitting: Neurology

## 2016-04-25 DIAGNOSIS — M4722 Other spondylosis with radiculopathy, cervical region: Secondary | ICD-10-CM | POA: Insufficient documentation

## 2016-04-25 DIAGNOSIS — G43109 Migraine with aura, not intractable, without status migrainosus: Secondary | ICD-10-CM

## 2016-04-25 DIAGNOSIS — M792 Neuralgia and neuritis, unspecified: Secondary | ICD-10-CM

## 2016-04-25 DIAGNOSIS — G6289 Other specified polyneuropathies: Secondary | ICD-10-CM | POA: Insufficient documentation

## 2016-04-25 DIAGNOSIS — M5410 Radiculopathy, site unspecified: Secondary | ICD-10-CM

## 2016-04-25 DIAGNOSIS — R937 Abnormal findings on diagnostic imaging of other parts of musculoskeletal system: Secondary | ICD-10-CM | POA: Insufficient documentation

## 2016-04-25 DIAGNOSIS — M4726 Other spondylosis with radiculopathy, lumbar region: Secondary | ICD-10-CM | POA: Insufficient documentation

## 2016-04-25 DIAGNOSIS — M9953 Intervertebral disc stenosis of neural canal of lumbar region: Secondary | ICD-10-CM | POA: Insufficient documentation

## 2016-04-25 DIAGNOSIS — K7689 Other specified diseases of liver: Secondary | ICD-10-CM | POA: Insufficient documentation

## 2016-04-25 DIAGNOSIS — N858 Other specified noninflammatory disorders of uterus: Secondary | ICD-10-CM | POA: Insufficient documentation

## 2016-05-02 ENCOUNTER — Ambulatory Visit (INDEPENDENT_AMBULATORY_CARE_PROVIDER_SITE_OTHER): Payer: BC Managed Care – PPO | Admitting: Neurology

## 2016-05-02 VITALS — BP 129/79 | HR 89 | Ht 60.0 in | Wt 124.0 lb

## 2016-05-02 DIAGNOSIS — E559 Vitamin D deficiency, unspecified: Secondary | ICD-10-CM

## 2016-05-02 DIAGNOSIS — F5101 Primary insomnia: Secondary | ICD-10-CM | POA: Insufficient documentation

## 2016-05-02 DIAGNOSIS — E538 Deficiency of other specified B group vitamins: Secondary | ICD-10-CM

## 2016-05-02 DIAGNOSIS — G43019 Migraine without aura, intractable, without status migrainosus: Secondary | ICD-10-CM

## 2016-05-02 DIAGNOSIS — G629 Polyneuropathy, unspecified: Secondary | ICD-10-CM

## 2016-05-02 MED ORDER — NARATRIPTAN HCL 2.5 MG PO TABS
5.0000 mg | ORAL_TABLET | ORAL | 11 refills | Status: DC | PRN
Start: 2016-05-02 — End: 2016-07-22

## 2016-05-02 MED ORDER — VITAMIN D (ERGOCALCIFEROL) 1.25 MG (50000 UT) PO CAPS
50000.0000 [IU] | ORAL_CAPSULE | ORAL | 4 refills | Status: DC
Start: 2016-05-02 — End: 2017-03-16

## 2016-05-02 NOTE — Patient Instructions (Signed)
These vitamins, when taken on a daily basis, can help to prevent headaches. For mild headaches, vitamins alone may be enough -- but they certainly have a helpful effect even if you are on a migraine prevention medicine.    Magnesium Oxide 400mg twice a day  Coenzyme Q10 (CoQ10) 100mg twice a day  Riboflavin 100mg twice a day  Vitamin B12 1000 mcg once a day    Do not take abortives more than 3 times a week combined, as these can worsen headaches even with moderate use. Examples are Tylenol, Motrin, Excedrin Migraine, Ibuprofin, Alleve, Sumatriptan, Fioricet, BC Powder or Tramadol. Avoid the use of opiate containing medications as much as possible (Percocet, Norco, Dilauded, Morphine, etc).

## 2016-05-02 NOTE — Progress Notes (Signed)
CC: headache; tingling    HPI:   History was obtained from patient     56 y.o. year-old female who continues to have migraines. She is not on preventive therapy but does take migraine vitamins. She uses Amerge 7 days a month with partial relief (naratriptan 2.5 mg each time). Her headaches are moderate intensity, frontal, last half a day, triggered by lights & sounds & smells. Associated with dizziness, but not a visual aura. She gets headaches 7 days/month. She occasionally gets Dizzy without a headache (last time was 2 weeks ago) that requires her to pull the car over.    Her parasthesias are somewhat improved spontaneously. She never tried the gabapentin. Triggered by lying on her back, causing compression on left side, resulting in radiating pain down the left leg. It occurs most times she lies down. However, while moderately painful, does not interfere with her quality of life.     Past Medical, Surgical, Social, Family History: Per Epic. Reviewed and updated. Also see scanned in sheet that accompanies this record (which was reviewed prior to scanning)    Med / Surg -   Past Medical History:   Diagnosis Date   . Migraine        Soc-   Social History     Social History   . Marital status: Married     Spouse name: N/A   . Number of children: N/A   . Years of education: N/A     Occupational History   . Not on file.     Social History Main Topics   . Smoking status: Former Games developer   . Smokeless tobacco: Never Used   . Alcohol use 0.6 oz/week     1 Glasses of wine per week   . Drug use: No   . Sexual activity: Yes     Other Topics Concern   . Not on file     Social History Narrative   . No narrative on file     Fam-   Family History   Problem Relation Age of Onset   . Arthritis Mother    . Cancer Father    . Cancer Maternal Grandfather          Review of Systems   Constitutional: Negative.    HENT: Negative.    Eyes: Negative.    Respiratory: Negative.    Cardiovascular: Negative.    Gastrointestinal: Negative.     Genitourinary: Negative.    Musculoskeletal: Positive for back pain, joint pain and neck pain.   Skin: Negative.    Neurological: Positive for tingling and headaches.   Endo/Heme/Allergies: Negative.    Psychiatric/Behavioral: The patient has insomnia.      All other systems reviewed and negative    Meds:  Current Outpatient Prescriptions on File Prior to Visit   Medication Sig Dispense Refill   . co-enzyme Q-10 50 MG capsule Take 100 mg by mouth daily.     . Cyanocobalamin (VITAMIN B 12 PO) Take by mouth.     Andrey Campanile VAGINAL 0.1 MG/GM vaginal cream 1 GRAM TWICE WEEKLY AND SMALL AMOUNT WITH FINGER AROUND OPENING FOR 4 WEEKS  3   . gabapentin (NEURONTIN) 300 MG capsule Take 1 capsule (300 mg total) by mouth nightly as needed (tingling pain). 30 capsule 11   . Magnesium 500 MG Tab Take 500 mg by mouth.       No current facility-administered medications on file prior to visit.  EXAM:  Visit Vitals  BP 129/79 (BP Site: Right arm, Patient Position: Sitting, Cuff Size: Medium)   Pulse 89   Ht 1.524 m (5')   Wt 56.2 kg (124 lb)   BMI 24.22 kg/m     Respiratory rate normal    General: Well developed and well nourished in no acute distress. There is no icterus or cyanosis or pedal edema. Peripheral pulses are intact.   Heart S1 S2 RRR  Lungs clear no wheezes  Abdomen soft, not tender  Skin: no rashes on extremities or joint swelling  Neck: not tender, normal ROM    Psychiatric. Cooperative. Thought content and process normal. Mood good. Affect congruent.       Mental Status: The patient was awake, alert, appeared oriented and was appropriate. Speech was fluent and the patient followed commands well. Attention, concentration, and memory appeared intact. Fund of knowledge appeared appropriate for education level.     Cranial Nerves: Pupils were equally round and reactive to light bilaterally. Extraocular movements were intact without nystagmus. Hearing was intact to conversational speech. Face was symmetric. Tongue  appeared midline.     Motor: At least 4+/5 throughout. Bulk appeared normal for body habitus. No obvious tremor noted.    Reflex: 2+ thoughout    Sensation: light touch was grossly intact.     Coordination: Finger to nose testing was intact without dysmetria.     Gait: Normal and steady.    Imaging reports were reviewed, and images were personally reviewed. It shows MRI C&L spine with disc disease but only mild foraminal narrowing at worst.    Labs were reviewed:   ANA panel normal  A1c, thyroid ok  B12 moderate low, below goal of 500  Vit D = 16, low.     Testing: normal EMG/NCS    ASSESSMENT / PLAN  Follow up in 3 months with me.    1. 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 For mild headaches, vitamins alone may be enough -- but they certainly have a helpful effect even if you are on a migraine prevention medicine.    Magnesium Oxide 400mg  twice a day  Coenzyme Q10 (CoQ10) 100mg  twice a day  Riboflavin 100mg  twice a day  Vitamin B12 1000 mcg once a day    Do not take abortives more than 3 times a week combined, as these can worsen headaches even with moderate use. Examples are Tylenol, Motrin, Excedrin Migraine, Ibuprofin, Alleve, Sumatriptan, Fioricet, BC Powder or Tramadol. Avoid the use of opiate containing medications as much as possible (Percocet, Norco, Dilauded, Morphine, etc).         2. Polyneuropathy  Mild compression neuropathy exacerbated by low vit B12 and low D levels  - gabapentin 300mg  qhs will help\  Physical therapy, massage, heat pad all will help out  Pharmacotherapy not indicated     3. Vitamin D deficiency  Rx 50,000 units a week. Vit D3. Recheck level before next visit   4. Vitamin B12  deficiency  Recommend daily. Recheck level before next visit   5. Insomnia. Gabapentin will help    Orders Placed This Encounter   Procedures   .  Vitamin D,25 OH, Total   . Vitamin B12       Yes if checked below:    X This patient requires treatment with a drug regiment that mandates intensive monitoring for efficacy and side effects. Gabapentin, amerge    Please contact me with any questions. Patients and [place] Providers can reach me via MyChart.    Kyrene Longan H. Theodoro Grist, MD, PhD  [facility]    [place]: [phone]  [place]: [phone]  [facility]: [phone]

## 2016-05-03 ENCOUNTER — Encounter (INDEPENDENT_AMBULATORY_CARE_PROVIDER_SITE_OTHER): Payer: Self-pay | Admitting: Neurology

## 2016-05-11 IMAGING — DX DG CHEST 2V
2 series · 2 of 2 positions shown · non-contrast
Comparison: None

CLINICAL DATA: Palpitations, dyspnea and headache.

EXAM:
CHEST - 2 VIEW

[chest pa]
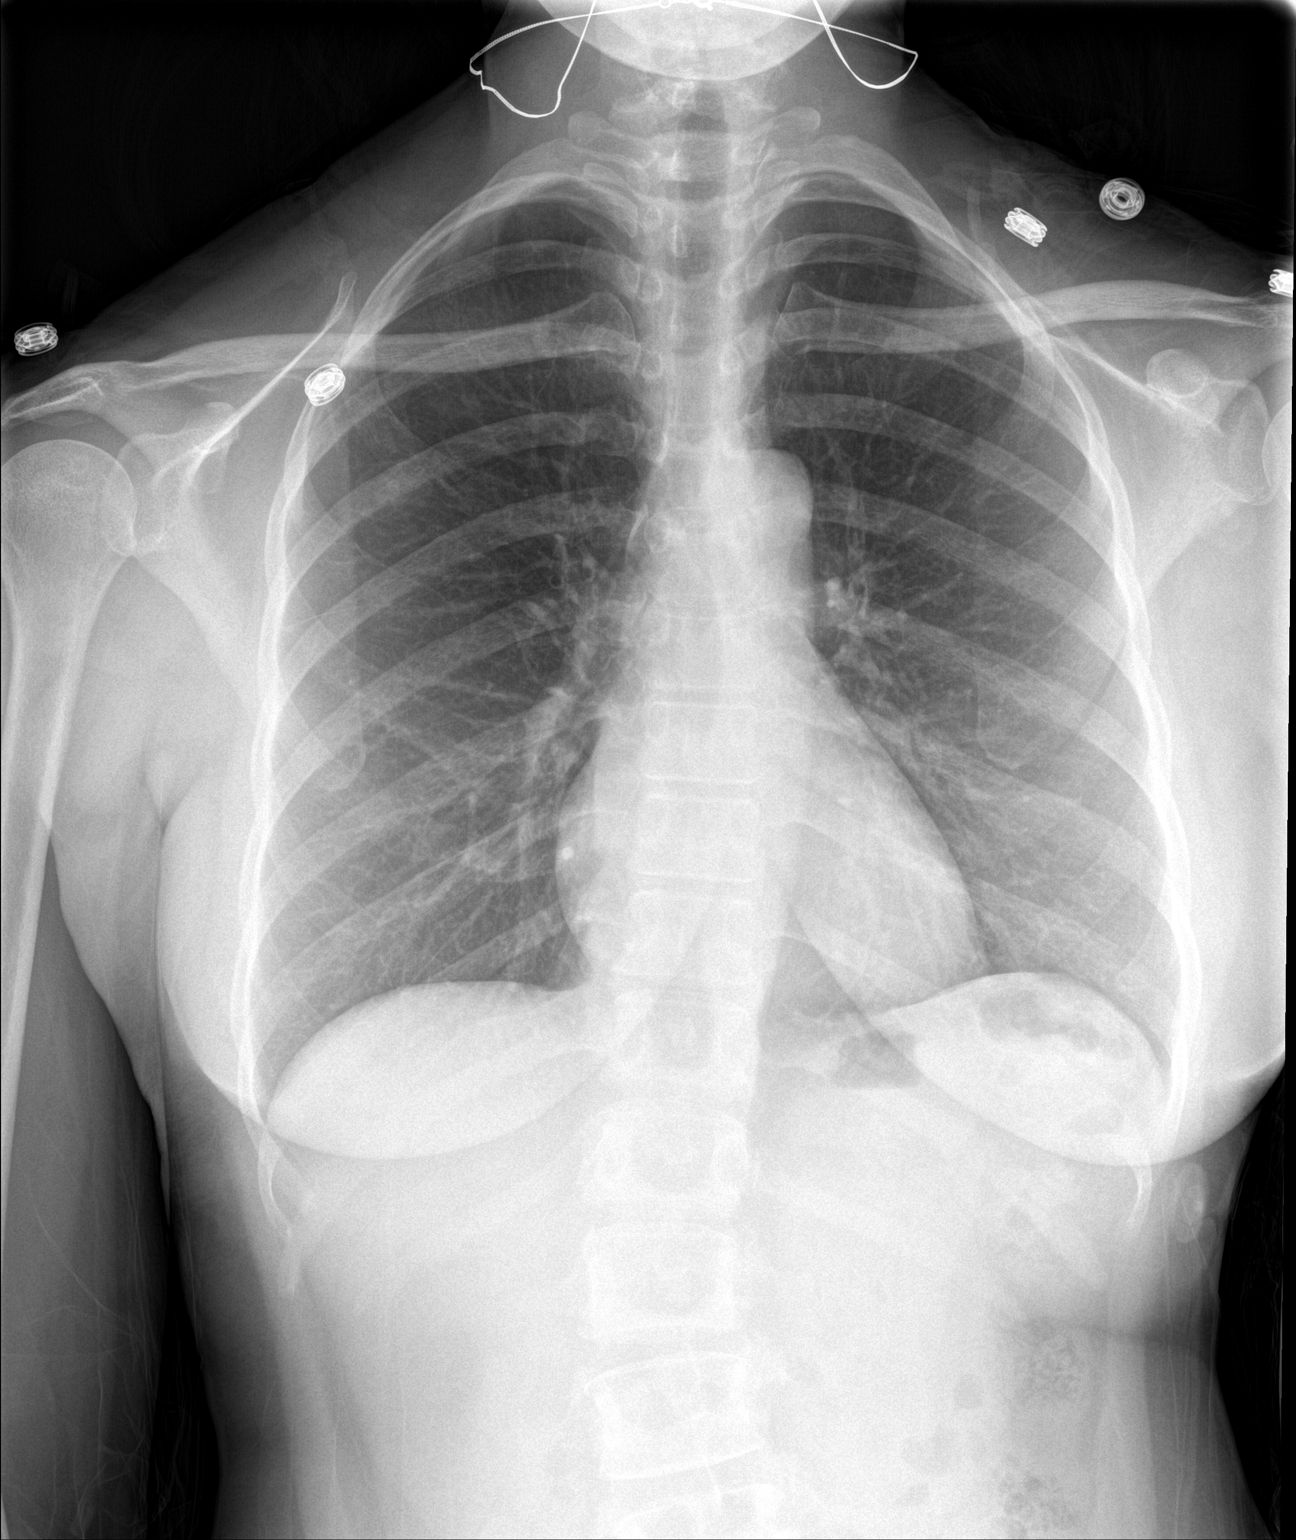

[chest lat]
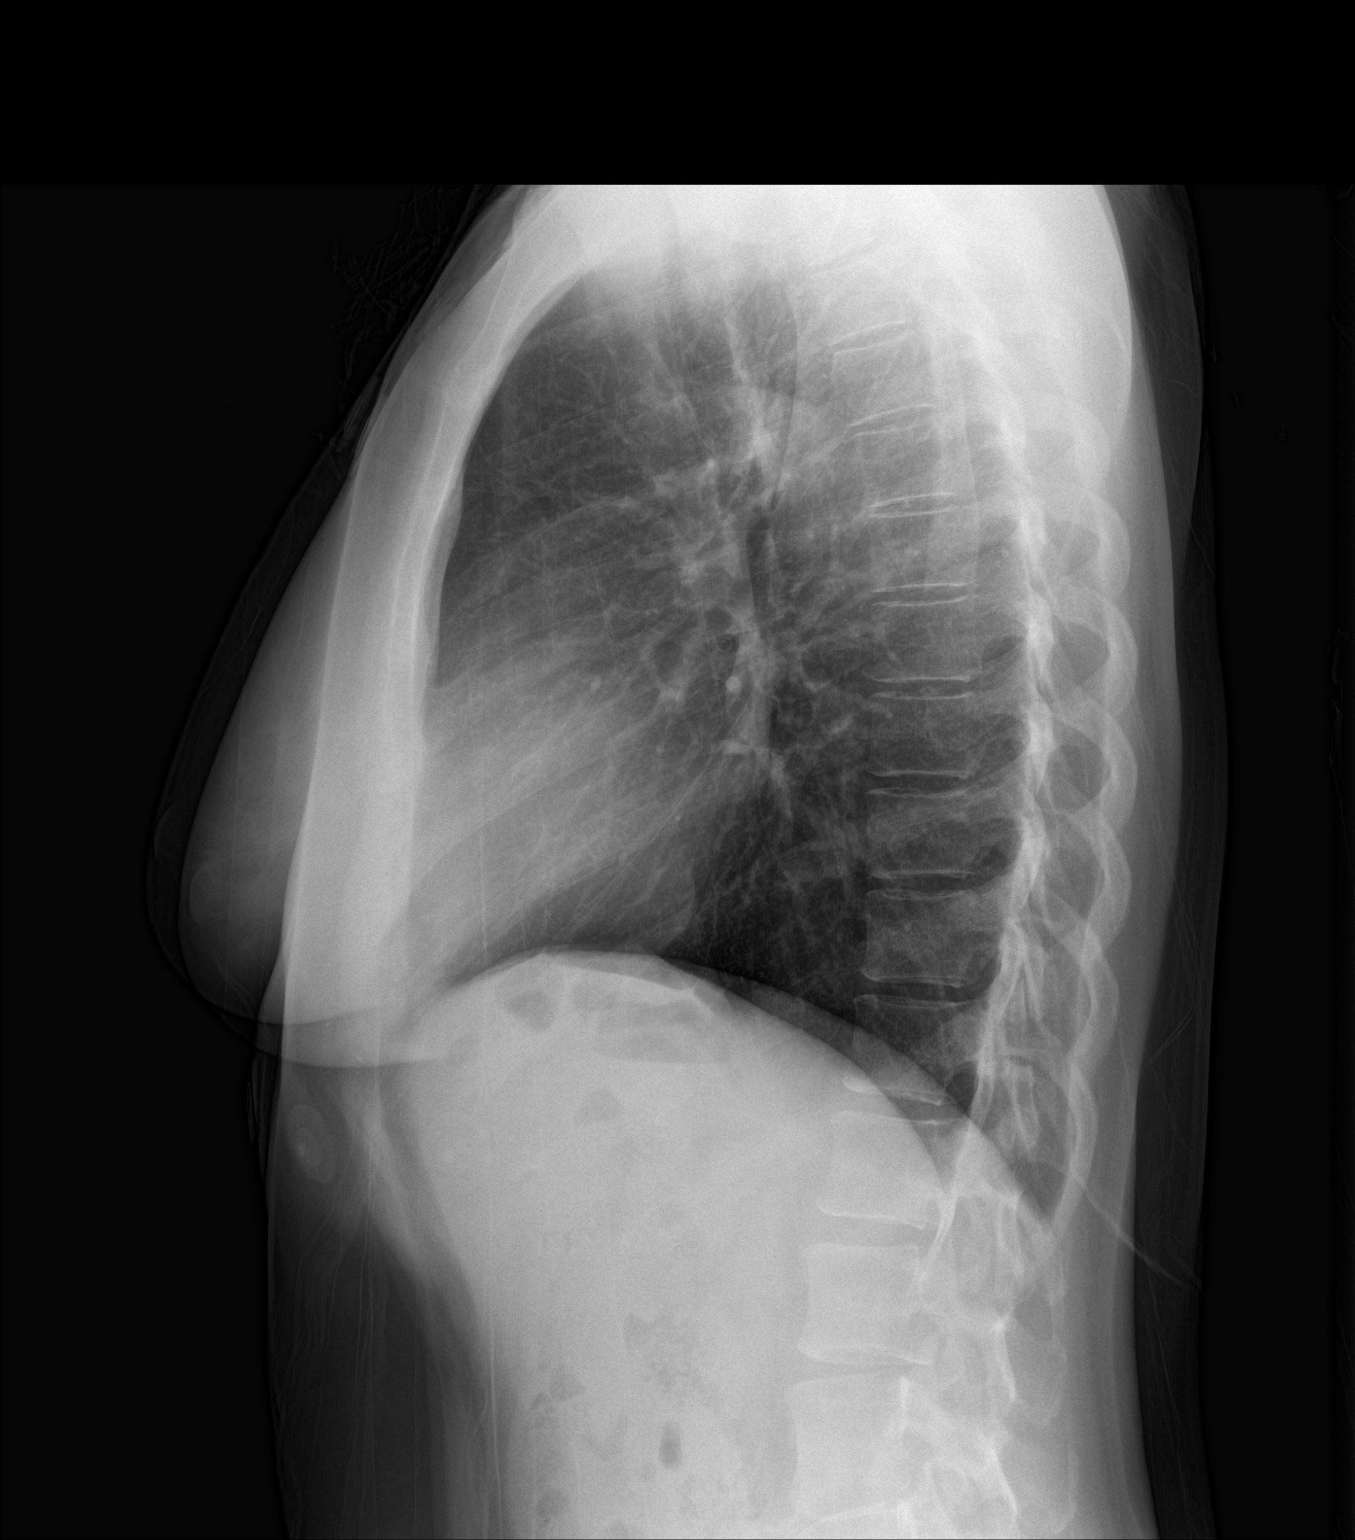

[2 of 2 positions shown; findings below may reference images not displayed]

FINDINGS: The heart size and mediastinal contours are within normal limits.
There is no evidence of pulmonary edema, consolidation,
pneumothorax, nodule or pleural fluid. The visualized skeletal
structures are unremarkable.
IMPRESSION: No active disease.

## 2016-07-22 ENCOUNTER — Ambulatory Visit (INDEPENDENT_AMBULATORY_CARE_PROVIDER_SITE_OTHER): Payer: BC Managed Care – PPO | Admitting: Neurology

## 2016-07-22 ENCOUNTER — Encounter (INDEPENDENT_AMBULATORY_CARE_PROVIDER_SITE_OTHER): Payer: Self-pay | Admitting: Neurology

## 2016-07-22 VITALS — BP 113/76 | HR 87

## 2016-07-22 DIAGNOSIS — E559 Vitamin D deficiency, unspecified: Secondary | ICD-10-CM

## 2016-07-22 DIAGNOSIS — G43019 Migraine without aura, intractable, without status migrainosus: Secondary | ICD-10-CM

## 2016-07-22 DIAGNOSIS — E538 Deficiency of other specified B group vitamins: Secondary | ICD-10-CM

## 2016-07-22 MED ORDER — VITAMIN B-12 1000 MCG PO TABS
1000.0000 ug | ORAL_TABLET | Freq: Every day | ORAL | 11 refills | Status: DC
Start: 2016-07-22 — End: 2020-08-17

## 2016-07-22 MED ORDER — COQ10 100 MG PO CAPS
100.0000 mg | ORAL_CAPSULE | Freq: Two times a day (BID) | ORAL | 11 refills | Status: DC
Start: 2016-07-22 — End: 2018-01-24

## 2016-07-22 MED ORDER — MAGNESIUM OXIDE 400 MG PO TABS
400.0000 mg | ORAL_TABLET | Freq: Two times a day (BID) | ORAL | 11 refills | Status: DC
Start: 2016-07-22 — End: 2019-07-05

## 2016-07-22 MED ORDER — RIBOFLAVIN 100 MG PO TABS
100.0000 mg | ORAL_TABLET | Freq: Two times a day (BID) | ORAL | 11 refills | Status: DC
Start: 2016-07-22 — End: 2019-07-05

## 2016-07-22 MED ORDER — NARATRIPTAN HCL 2.5 MG PO TABS
5.0000 mg | ORAL_TABLET | ORAL | 11 refills | Status: DC | PRN
Start: 2016-07-22 — End: 2017-09-20

## 2016-07-22 NOTE — Patient Instructions (Signed)
Take every day to prevent migraines:  Magnesium Oxide 400mg  twice a day  Coenzyme Q10 (CoQ10) 100mg  twice a day  Riboflavin 100mg  twice a day (may need to be special ordered or from [employer])  Vitamin B12 1000 mcg once a day    Do not take abortives more than 2-3 days a week combined, as these can cause rebound headaches. These include over-the-counter pain meds, triptans. Tramadol & narcotics worsen headaches if used even once a week.    Take gabapentin (300mg ) every night    Use 2 amerge pills at start of a headache. If needed, can combine with 1-2 Excedrin Migraine pills.

## 2016-07-22 NOTE — Progress Notes (Signed)
CC: migraines    HPI:   History was obtained from patient     56 y.o. year-old female with ongoing migraines. 7 days of headaches a month. 2-3 headaches, each lasting 2-3 days. Severity is moderate. Worse with bright lights & loud sounds. Of note, she is not taking gabapentin consistently. She takes vitamins but not exactly as recommended.     Past Medical, Surgical, Social, Family History: Per Epic. Reviewed and updated. Also see scanned in sheet that accompanies this record (which was reviewed prior to scanning)    Med / Surg -   Past Medical History:   Diagnosis Date   . Migraine      Soc-   Social History     Social History   . Marital status: Married     Spouse name: N/A   . Number of children: N/A   . Years of education: N/A     Occupational History   . Not on file.     Social History Main Topics   . Smoking status: Former Games developer   . Smokeless tobacco: Never Used   . Alcohol use 0.6 oz/week     1 Glasses of wine per week   . Drug use: No   . Sexual activity: Yes     Other Topics Concern   . Not on file     Social History Narrative   . No narrative on file     Fam- Noncontributory  Family History   Problem Relation Age of Onset   . Arthritis Mother    . Cancer Father    . Cancer Maternal Grandfather        Review of Systems   Constitutional: Positive for malaise/fatigue.   HENT: Negative.    Eyes: Positive for photophobia.   Respiratory: Negative.    Cardiovascular: Negative.    Gastrointestinal: Negative.    Genitourinary: Negative.    Musculoskeletal: Negative.  Negative for neck pain.   Skin: Negative.    Neurological: Positive for dizziness and headaches.   Endo/Heme/Allergies: Negative.    Psychiatric/Behavioral: Negative.    All other systems reviewed and are negative.    All other systems reviewed and negative    Meds:  Current Outpatient Prescriptions on File Prior to Visit   Medication Sig Dispense Refill   . co-enzyme Q-10 50 MG capsule Take 100 mg by mouth daily.     . Cyanocobalamin (VITAMIN B 12  PO) Take by mouth.     Andrey Campanile VAGINAL 0.1 MG/GM vaginal cream 1 GRAM TWICE WEEKLY AND SMALL AMOUNT WITH FINGER AROUND OPENING FOR 4 WEEKS  3   . gabapentin (NEURONTIN) 300 MG capsule Take 1 capsule (300 mg total) by mouth nightly as needed (tingling pain). 30 capsule 11   . Magnesium 500 MG Tab Take 500 mg by mouth.     . vitamin D, ergocalciferol, (DRISDOL) 50000 UNIT Cap Take 1 capsule (50,000 Units total) by mouth once a week. 10 capsule 4   . [DISCONTINUED] naratriptan (AMERGE) 2.5 MG tablet Take 2 tablets (5 mg total) by mouth as needed for Migraine.MAX 2 days a week 20 tablet 11     No current facility-administered medications on file prior to visit.        EXAM:  Visit Vitals  BP 113/76   Pulse 87     Respiratory rate normal    General: Well developed and well nourished in no acute distress. There is no icterus or cyanosis or pedal edema.  Peripheral pulses are intact.   Heart S1 S2 RRR  Lungs clear no wheezes  Abdomen soft, not tender  Skin: no rashes on extremities or joint swelling  Neck: not tender, normal ROM    Psychiatric. Cooperative. Thought content and process normal. Mood good. Affect congruent.     Mental Status: The patient was awake, alert, appeared oriented and was appropriate. Speech was fluent and the patient followed commands well. Attention, concentration, and memory appeared intact. Fund of knowledge appeared appropriate for education level.     Cranial Nerves: Pupils were equally round and reactive to light bilaterally. Extraocular movements were intact without nystagmus. Hearing was intact to conversational speech. Face was symmetric. Tongue appeared midline.     Motor: 5/5 throughout. Bulk appeared normal for body habitus. No obvious tremor noted.    Reflex: 2+ thoughout    Sensation: light touch was grossly intact.     Coordination: Finger to nose testing was intact without dysmetria.     Gait: Normal and steady.    Labs ordered    ASSESSMENT / PLAN  Follow up in 3 months with  me.    1. Migraines, chronic, persistent  - Gabapentin 300mg  qHS. Pt encouraged to be compliant. This is neuro specific. Toxicity discussed.  Take every day to prevent migraines - directions reinforced, she takes Mag & CoQ at half the suggested dose.   Magnesium Oxide 400mg  twice a day  Coenzyme Q10 (CoQ10) 100mg  twice a day   Riboflavin 100mg  twice a day (may need to be special ordered or from [employer])  Vitamin B12 1000 mcg once a day    Do not take abortives more than 2-3 days a week combined, as these can cause rebound headaches. These include over-the-counter pain meds, triptans. Tramadol & narcotics worsen headaches if used even once a week.    2. Vitamin D deficiency. On s upplement  -recheck level    3. Vitamin B12 deficiency. ON supplement.   Recheck level.        1. Vitamin D deficiency  Vitamin D,25 OH, Total    Vitamin B12   2. Intractable migraine without aura and without status migrainosus  naratriptan (AMERGE) 2.5 MG tablet    Vitamin D,25 OH, Total    Vitamin B12   3. Vitamin B12 deficiency  Vitamin D,25 OH, Total    Vitamin B12       Orders Placed This Encounter   Procedures   . Vitamin D,25 OH, Total   . Vitamin B12         Please contact me with any questions. Patients and [place] Providers can reach me via MyChart.    Uzziah Rigg H. Theodoro Grist, MD, PhD  [facility]    [place]: [phone]  [place] [facility]: [phone]  [facility]: [phone]

## 2016-10-14 ENCOUNTER — Ambulatory Visit (INDEPENDENT_AMBULATORY_CARE_PROVIDER_SITE_OTHER): Payer: BC Managed Care – PPO | Admitting: Neurology

## 2016-11-03 ENCOUNTER — Encounter (INDEPENDENT_AMBULATORY_CARE_PROVIDER_SITE_OTHER): Payer: Self-pay

## 2016-11-03 NOTE — Progress Notes (Signed)
Faxed labs from 02/25/16 to PCP - Dr. Johnathan Hausen and emailed new lab orders to patient per patient's request.

## 2016-11-14 ENCOUNTER — Ambulatory Visit (INDEPENDENT_AMBULATORY_CARE_PROVIDER_SITE_OTHER): Payer: BC Managed Care – PPO | Admitting: Neurology

## 2016-11-14 ENCOUNTER — Encounter (INDEPENDENT_AMBULATORY_CARE_PROVIDER_SITE_OTHER): Payer: Self-pay | Admitting: Neurology

## 2016-11-14 VITALS — BP 114/76 | HR 73

## 2016-11-14 DIAGNOSIS — H209 Unspecified iridocyclitis: Secondary | ICD-10-CM

## 2016-11-14 DIAGNOSIS — G43019 Migraine without aura, intractable, without status migrainosus: Secondary | ICD-10-CM

## 2016-11-14 DIAGNOSIS — F5101 Primary insomnia: Secondary | ICD-10-CM

## 2016-11-14 DIAGNOSIS — M542 Cervicalgia: Secondary | ICD-10-CM

## 2016-11-14 MED ORDER — GABAPENTIN 300 MG PO CAPS
600.0000 mg | ORAL_CAPSULE | Freq: Every evening | ORAL | 3 refills | Status: DC | PRN
Start: 2016-11-14 — End: 2018-01-24

## 2016-11-14 NOTE — Progress Notes (Signed)
CC: migraine    HPI:   History was obtained from patient    56 y.o. year-old female with 8-12 migraines a month. She takes migraine supplement and gabapentin 300mg  qHS. No side effects from gabapentin, but also did not feel it affected headaches. She stopped it a week ago, and has had increased migraine frequency. Headahce location is retro-orbital, worse with bright lights, associated with neck pain    She was also diagnosed with uveitis.     Past Medical, Surgical, Social, Family History: Per Epic. Reviewed and updated. Also see scanned in sheet that accompanies this record (which was reviewed prior to scanning)    Med / Surg  Past Medical History:   Diagnosis Date   . Migraine        Soc  Social History     Social History   . Marital status: Married     Spouse name: N/A   . Number of children: N/A   . Years of education: N/A     Occupational History   . Not on file.     Social History Main Topics   . Smoking status: Former Games developer   . Smokeless tobacco: Never Used   . Alcohol use 0.6 oz/week     1 Glasses of wine per week   . Drug use: No   . Sexual activity: Yes     Other Topics Concern   . Not on file     Social History Narrative   . No narrative on file       Fam-   Noncontributory  Family History   Problem Relation Age of Onset   . Arthritis Mother    . Cancer Father    . Cancer Maternal Grandfather        Review of Systems   Constitutional: Positive for malaise/fatigue.   HENT: Negative.    Eyes: Positive for blurred vision (occasional, more at night) and photophobia.   Respiratory: Negative.    Cardiovascular: Negative.    Gastrointestinal: Negative.    Genitourinary: Negative.    Musculoskeletal: Positive for myalgias and neck pain.   Skin: Negative.    Neurological: Negative.    Endo/Heme/Allergies: Negative.    Psychiatric/Behavioral: Negative.      All other systems reviewed and negative    Meds:  Current Outpatient Prescriptions on File Prior to Visit   Medication Sig Dispense Refill   . Coenzyme Q10  (COQ10) 100 MG Cap Take 100 mg by mouth 2 (two) times daily. 100 each 11   . ESTRACE VAGINAL 0.1 MG/GM vaginal cream 1 GRAM TWICE WEEKLY AND SMALL AMOUNT WITH FINGER AROUND OPENING FOR 4 WEEKS  3   . gabapentin (NEURONTIN) 300 MG capsule Take 1 capsule (300 mg total) by mouth nightly as needed (tingling pain). 30 capsule 11   . magnesium oxide (MAG-OX) 400 MG tablet Take 1 tablet (400 mg total) by mouth 2 (two) times daily. 500 tablet 11   . naratriptan (AMERGE) 2.5 MG tablet Take 2 tablets (5 mg total) by mouth as needed for Migraine.MAX 2 days a week 20 tablet 11   . Riboflavin 100 MG Tab Take 100 mg by mouth 2 (two) times daily. 100 each 11   . vitamin B-12 (CYANOCOBALAMIN) 1000 MCG tablet Take 1 tablet (1,000 mcg total) by mouth daily. 100 tablet 11   . vitamin D, ergocalciferol, (DRISDOL) 50000 UNIT Cap Take 1 capsule (50,000 Units total) by mouth once a week. 10 capsule 4  No current facility-administered medications on file prior to visit.        EXAM:  Visit Vitals  BP 114/76   Pulse 73     Respiratory rate normal    General: Well developed and well nourished in no acute distress. There is no icterus or cyanosis or pedal edema. Peripheral pulses are intact.   Heart S1 S2 RRR  Lungs clear no wheezes  Abdomen soft, not tender  Skin: no rashes on extremities or joint swelling  Neck: not tender, normal ROM    Psychiatric. Cooperative. Thought content and process normal. Mood good. Affect congruent.     Mental Status: The patient was awake, alert, appeared oriented and was appropriate. Speech was fluent and the patient followed commands well. Attention, concentration, and memory appeared intact. Fund of knowledge appeared appropriate for education level.     Cranial Nerves: Pupils were equally round and reactive to light bilaterally. Extraocular movements were intact without nystagmus. Hearing was intact to conversational speech. Face was symmetric. Tongue appeared midline.     Motor: grossly 5/5 throughout.  Bulk appeared normal for body habitus. No obvious tremor noted.    Reflex: 2+ throughout    Sensation: light touch was grossly intact.     Coordination: Finger to nose testing was intact without dysmetria.     Gait: Normal and steady.    Imaging   Orders below    Labs:  GFR>60    ASSESSMENT / PLAN  Follow up in 2 months    1. MIgraine, chronic, persistent, worsening  2. Uveitis, new  3. Neck pain, due to #1  4. Primary insomnia, chronic  - MRI brain to r/o CNS inflammation given new dx of uveiits  - increase gabapentin to 600mg  qHS  Continue migraine supplement    Take every day to prevent migraines:  Magnesium Oxide 400mg  twice a day  Coenzyme Q10 (CoQ10) 100mg  twice a day  Riboflavin 100mg  twice a day (may need to be special ordered or from [employer])  Vitamin B12 1000 mcg once a day    Do not take abortives more than 2-3 days a week combined, as these can cause rebound headaches. These include over-the-counter pain meds, triptans. Tramadol & narcotics worsen headaches if used even once a week.          ICD-10-CM    1. Intractable migraine without aura and without status migrainosus G43.019 MRI Brain W WO Contrast   2. Primary insomnia F51.01 MRI Brain W WO Contrast   3. Uveitis H20.9 MRI Brain W WO Contrast   4. Neck pain M54.2        Orders Placed This Encounter   Procedures   . MRI Brain W WO Contrast     Please contact me with any questions. Patients and [place] Providers can reach me via MyChart.    Roma Bierlein H. Theodoro Grist, MD, PhD  [facility]  Asst Professor, [facility]  : [phone]  [place] [facility]: [phone]

## 2016-11-14 NOTE — Patient Instructions (Signed)
MRI    [facility] (Preferred)  Use [address] Location only  [email or web address]  [phone]    [facility] [place]  A service of  [facility]  [address]  [address]  [place], [place] [address]  [phone]    Call our office if neither of the above locations are accepted by your insurance.  Less good alternative is any other  or [facility] radiology location.

## 2016-11-30 ENCOUNTER — Ambulatory Visit: Payer: BC Managed Care – PPO | Attending: Neurology

## 2016-11-30 DIAGNOSIS — F5101 Primary insomnia: Secondary | ICD-10-CM

## 2016-11-30 DIAGNOSIS — H209 Unspecified iridocyclitis: Secondary | ICD-10-CM | POA: Insufficient documentation

## 2016-11-30 DIAGNOSIS — G43019 Migraine without aura, intractable, without status migrainosus: Secondary | ICD-10-CM | POA: Insufficient documentation

## 2016-11-30 MED ORDER — GADOBUTROL 1 MMOL/ML IV SOLN
6.0000 mL | Freq: Once | INTRAVENOUS | Status: AC | PRN
Start: 2016-11-30 — End: 2016-11-30
  Administered 2016-11-30: 21:00:00 6 mmol via INTRAVENOUS
  Filled 2016-11-30: qty 7.5

## 2016-12-28 ENCOUNTER — Encounter (INDEPENDENT_AMBULATORY_CARE_PROVIDER_SITE_OTHER): Payer: Self-pay | Admitting: Neurology

## 2016-12-28 ENCOUNTER — Other Ambulatory Visit (INDEPENDENT_AMBULATORY_CARE_PROVIDER_SITE_OTHER): Payer: Self-pay

## 2016-12-28 DIAGNOSIS — E559 Vitamin D deficiency, unspecified: Secondary | ICD-10-CM

## 2016-12-28 DIAGNOSIS — G43019 Migraine without aura, intractable, without status migrainosus: Secondary | ICD-10-CM

## 2016-12-28 DIAGNOSIS — E538 Deficiency of other specified B group vitamins: Secondary | ICD-10-CM

## 2017-01-16 ENCOUNTER — Ambulatory Visit (INDEPENDENT_AMBULATORY_CARE_PROVIDER_SITE_OTHER): Payer: BC Managed Care – PPO | Admitting: Neurology

## 2017-01-16 ENCOUNTER — Encounter (INDEPENDENT_AMBULATORY_CARE_PROVIDER_SITE_OTHER): Payer: Self-pay | Admitting: Neurology

## 2017-01-16 VITALS — BP 111/75 | HR 82 | Ht 60.0 in | Wt 122.0 lb

## 2017-01-16 DIAGNOSIS — H209 Unspecified iridocyclitis: Secondary | ICD-10-CM

## 2017-01-16 DIAGNOSIS — G629 Polyneuropathy, unspecified: Secondary | ICD-10-CM

## 2017-01-16 DIAGNOSIS — E538 Deficiency of other specified B group vitamins: Secondary | ICD-10-CM

## 2017-01-16 DIAGNOSIS — F5101 Primary insomnia: Secondary | ICD-10-CM

## 2017-01-16 DIAGNOSIS — E559 Vitamin D deficiency, unspecified: Secondary | ICD-10-CM

## 2017-01-16 DIAGNOSIS — G43019 Migraine without aura, intractable, without status migrainosus: Secondary | ICD-10-CM

## 2017-01-16 NOTE — Progress Notes (Signed)
CC: migraine.     HPI:   History was obtained from patient,     56 y.o. year-old female with migraines, currently on gabapentin 300mg  qhs. She has not increased up to 600mg  qHS. It does make her a bit sleepy. She is not convinced that gabapentin is helping her. She still gets 9-11 headaches a month. No associated dizziness. WHen she has a headache, bright lights & loud sounds make it worse. She is not sensitive to bright lights in the absence of a headache. Weather changes can trigger the headache.     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    Past Medical, Surgical, Social, Family History: Per Epic. Reviewed and updated. Also see scanned in sheet that accompanies this record (which was reviewed prior to scanning)    Med / Surg  Past Medical History:   Diagnosis Date   . Migraine        Soc  Social History     Social History   . Marital status: Married     Spouse name: N/A   . Number of children: N/A   . Years of education: N/A     Occupational History   . Not on file.     Social History Main Topics   . Smoking status: Former Games developer   . Smokeless tobacco: Never Used   . Alcohol use 0.6 oz/week     1 Glasses of wine per week   . Drug use: No   . Sexual activity: Yes     Other Topics Concern   . Not on file     Social History Narrative   . No narrative on file       Fam-   Noncontributory  Family History   Problem Relation Age of Onset   . Arthritis Mother    . Cancer Father    . Cancer Maternal Grandfather        Review of Systems   Constitutional: Negative for malaise/fatigue.   HENT: Negative.    Eyes: Positive for photophobia.   Respiratory: Negative.    Cardiovascular: Negative.    Gastrointestinal: Negative.    Genitourinary: Negative.    Musculoskeletal: Positive for joint pain. Negative for falls.   Skin: Negative.    Neurological: Positive for headaches.   Endo/Heme/Allergies: Negative.    Psychiatric/Behavioral: Negative.      All other systems reviewed and  negative    Meds:  Current Outpatient Prescriptions on File Prior to Visit   Medication Sig Dispense Refill   . Coenzyme Q10 (COQ10) 100 MG Cap Take 100 mg by mouth 2 (two) times daily. 100 each 11   . ESTRACE VAGINAL 0.1 MG/GM vaginal cream 1 GRAM TWICE WEEKLY AND SMALL AMOUNT WITH FINGER AROUND OPENING FOR 4 WEEKS  3   . gabapentin (NEURONTIN) 300 MG capsule Take 2 capsules (600 mg total) by mouth nightly as needed (tingling pain). 180 capsule 3   . magnesium oxide (MAG-OX) 400 MG tablet Take 1 tablet (400 mg total) by mouth 2 (two) times daily. 500 tablet 11   . naratriptan (AMERGE) 2.5 MG tablet Take 2 tablets (5 mg total) by mouth as needed for Migraine.MAX 2 days a week 20 tablet 11   . prednisoLONE acetate (PRED FORTE) 1 % ophthalmic suspension      . Riboflavin 100 MG Tab Take 100 mg by mouth 2 (two) times daily. 100 each 11   . vitamin B-12 (CYANOCOBALAMIN) 1000 MCG tablet  Take 1 tablet (1,000 mcg total) by mouth daily. 100 tablet 11   . vitamin D, ergocalciferol, (DRISDOL) 50000 UNIT Cap Take 1 capsule (50,000 Units total) by mouth once a week. 10 capsule 4     No current facility-administered medications on file prior to visit.        EXAM:  Visit Vitals  BP 111/75 (BP Site: Right arm, Patient Position: Sitting, Cuff Size: Medium)   Pulse 82   Ht 1.524 m (5')   Wt 55.3 kg (122 lb)   BMI 23.83 kg/m     Respiratory rate normal    General: Well developed and well nourished in no acute distress. There is no icterus or cyanosis or pedal edema. Peripheral pulses are intact.   Heart S1 S2 RRR  Lungs clear no wheezes  Abdomen soft, not tender  Skin: no rashes on extremities or joint swelling  Neck: not tender, normal ROM    Psychiatric. Cooperative. Thought content and process normal. Mood good. Affect congruent.     Mental Status: The patient was awake, alert, appeared oriented and was appropriate. Speech was fluent and the patient followed commands well. Attention, concentration, and memory appeared intact.  Fund of knowledge appeared appropriate for education level.     Cranial Nerves: Pupils were equally round and reactive to light bilaterally. Extraocular movements were intact without nystagmus. Hearing was intact to conversational speech. Face was symmetric. Tongue appeared midline.     Motor: grossly 5/5 throughout. Bulk appeared normal for body habitus. No obvious tremor noted.    Reflex: 2+ throughout    Sensation: light touch was grossly intact.     Coordination: Finger to nose testing was intact without dysmetria.     Gait: Normal and steady.    Imaging   Orders below  My summary based on personal review of images:   MRI brain normal  MRI C spine with minimal degree of foraminal stenosis, no cord compression  MRI L spine with formainal narrowing that is minimally, bilateral at L4-S1.     Testing:  Orders below  EMG/NCS of UE's and LE's normal 04/20/16    Labs:  Orders below  VitD 48 11/2016  Vit B12 1800 11/2016    ASSESSMENT / PLAN  Follow up in 3 months    1. Migraine, persistent, uncontrolled  2. Neuropathy, well controlled  3. Vitamin D deficiency, at goal. Well controlled on supplement  4. Vitamin B12 deficiency, at goal. Well controlled on supplement  5. Insomnia, well controlled  6. Uveitis, follows with rheumatology, on methotrexate. New issue.    - increase gabapentin to 600mg  qHS. Risk/benefit/toxicity discussed.  - start ajovy 225mg  monthly subcut. Risk/benefit/toxicity discussed. Migraine preventive.         ICD-10-CM    1. Intractable migraine without aura and without status migrainosus G43.019    2. 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 Vitamin D deficiency E55.9    4. Vitamin B12 deficiency E53.8    5. 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 Uveitis H20.9        Please contact me with any questions. Patients and [place] Providers can reach me via MyChart.    Kennie Karapetian H. Theodoro Grist, MD, PhD  [facility]  Asst Professor, [facility]  [place]: [phone]  [place]ai: [phone]

## 2017-03-16 ENCOUNTER — Other Ambulatory Visit (INDEPENDENT_AMBULATORY_CARE_PROVIDER_SITE_OTHER): Payer: Self-pay | Admitting: Neurology

## 2017-03-16 DIAGNOSIS — G629 Polyneuropathy, unspecified: Secondary | ICD-10-CM

## 2017-03-16 DIAGNOSIS — E559 Vitamin D deficiency, unspecified: Secondary | ICD-10-CM

## 2017-09-20 ENCOUNTER — Ambulatory Visit (INDEPENDENT_AMBULATORY_CARE_PROVIDER_SITE_OTHER): Payer: BLUE CROSS/BLUE SHIELD | Admitting: Neurology

## 2017-09-20 ENCOUNTER — Encounter (INDEPENDENT_AMBULATORY_CARE_PROVIDER_SITE_OTHER): Payer: Self-pay | Admitting: Neurology

## 2017-09-20 VITALS — BP 122/79 | HR 77 | Resp 16 | Ht 60.0 in | Wt 121.0 lb

## 2017-09-20 DIAGNOSIS — F5101 Primary insomnia: Secondary | ICD-10-CM

## 2017-09-20 DIAGNOSIS — G43019 Migraine without aura, intractable, without status migrainosus: Secondary | ICD-10-CM

## 2017-09-20 MED ORDER — NARATRIPTAN HCL 2.5 MG PO TABS
ORAL_TABLET | ORAL | 11 refills | Status: DC
Start: 2017-09-20 — End: 2018-11-13

## 2017-09-20 MED ORDER — ERENUMAB-AOOE 70 MG/ML SC SOAJ
70.0000 mg | SUBCUTANEOUS | 11 refills | Status: DC
Start: 2017-09-20 — End: 2017-09-27

## 2017-09-20 NOTE — Progress Notes (Signed)
09/20/2017    Patient ID: [name] [name]    Chief Complaint:  Headaches    History of Present Illness:  [name] [name] is a 57 y.o. female who presents for follow up of headaches.  She was previously seen by my colleague Dr. Lora Havens.  She was last seen on 01/16/2017.  At that time, he recommended increasing Gabapentin dose and starting on Fremanezumab.     She has had headaches since her teenage years.  Tended to be clustered around her menses.  She is been menopausal for several years but continues to have headaches.  Typical headache starts behind the ear.  Tends to be right-sided but can sometimes be on the left.  Radiates to the bifrontal region.  Moderate in intensity.  No nausea or vomiting.  Positive photophobia and phonophobia.  Can last for several hours.  Triggered by alcohol intake.  Uses naratriptan for relief but headache can recur later the same day or the following day.  At her last visit with Dr. Theodoro Grist he recommended increasing her gabapentin dose; she found the 600 mg dose too sedating so was continued on 300 mg nightly.  She doesn't feel this helps her headaches but it does help her sleep.   Previous preventive medicines also include magnesium and riboflavin.  She has not been on any other prescription strength preventive medications.  Currently treating headaches with naratriptan; previous abortive medications include sumatriptan subcu and zolmitriptan nasal spray.  She was not able to tolerate sumatriptan subcutaneous due to flushing reaction.  She is unable to tolerate zolmitriptan nasal spray due to nasal congestion irritation.  She never started [place] because she had a several month period with headache improvement.  Headache subsequently worsened in May, June, July.  Reports 7-11 headache days per month.  Also experiencing vertigo with more recent headache attacks.    Review of Systems:  Constitutional: Negative for fever.   Respiratory: Negative for shortness of breath.    Cardiovascular:  Negative for chest pain.   Gastrointestinal: Negative for abdominal pain.   Neurological: Negative for visual changes, alteration in awareness, speech or language problems, facial droop, focal weakness, focal numbness, limb incoordination, gait ataxia, dysphagia.  Positive migraines.  Positive vertigo.  Psychiatric/Behavioral: Negative for mood disturbance.   All other systems reviewed and are negative except pertinent positives as noted above in HPI.     Past Medical History:  Past Medical History:   Diagnosis Date   . Migraine    Vitamin D Deficiency  Vitamin B12 Deficiency    Past Surgical History:  Past Surgical History:   Procedure Laterality Date   . TUBAL LIGATION       Allergies:  Sulfa antibiotics    Family History:  Family History   Problem Relation Age of Onset   . Arthritis Mother    . Cancer Father    . Cancer Maternal Grandfather    No other family history related to current complaints.    Social History:  Social History     Social History   . Marital status: Married     Spouse name: N/A   . Number of children: N/A   . Years of education: N/A     Occupational History   . Not on file.     Social History Main Topics   . Smoking status: Former Games developer   . Smokeless tobacco: Never Used   . Alcohol use 0.6 oz/week     1 Glasses of wine per week   .  Drug use: No   . Sexual activity: Yes     Other Topics Concern   . Not on file     Social History Narrative   . No narrative on file       Medications:  Current Outpatient Prescriptions   Medication Sig Dispense Refill   . Coenzyme Q10 (COQ10) 100 MG Cap Take 100 mg by mouth 2 (two) times daily. 100 each 11   . ESTRACE VAGINAL 0.1 MG/GM vaginal cream 1 GRAM TWICE WEEKLY AND SMALL AMOUNT WITH FINGER AROUND OPENING FOR 4 WEEKS  3   . gabapentin (NEURONTIN) 300 MG capsule Take 2 capsules (600 mg total) by mouth nightly as needed (tingling pain). 180 capsule 3   . leucovorin (WELLCOVORIN) 5 MG tablet Take 5 mg by mouth once a week.     . magnesium oxide (MAG-OX) 400  MG tablet Take 1 tablet (400 mg total) by mouth 2 (two) times daily. 500 tablet 11   . methotrexate (RHEUMATREX) 2.5 MG tablet Take 2.5 mg by mouth once a week.     . naratriptan (AMERGE) 2.5 MG tablet Take 2 tablets (5 mg total) by mouth as needed for Migraine.MAX 2 days a week 20 tablet 11   . prednisoLONE acetate (PRED FORTE) 1 % ophthalmic suspension      . Riboflavin 100 MG Tab Take 100 mg by mouth 2 (two) times daily. 100 each 11   . vitamin B-12 (CYANOCOBALAMIN) 1000 MCG tablet Take 1 tablet (1,000 mcg total) by mouth daily. 100 tablet 11   . vitamin D, ergocalciferol, (DRISDOL) 50000 UNIT Cap TAKE 1 CAPSULE (50,000 UNITS TOTAL) BY MOUTH ONCE A WEEK. 10 capsule 3     No current facility-administered medications for this visit.        General Exam:  BP 122/79   Pulse 77   Resp 16   Ht 1.524 m (5')   Wt 54.9 kg (121 lb)   BMI 23.63 kg/m   Gen:  Well-developed, well-nourished, NAD.  HEENT:  NC/AT.  Neck:  Supple. Normal range of motion.   CV:  RRR.  S1/S2.  No murmurs.  No carotid bruits.   Lungs:  CTA bilaterally.   Extrem:  No edema.  Skin:  No obvious rashes or lesions.   Neuro Exam:  MENTAL STATUS:  Awake, alert, oriented x 3.  Normal attention and concentration. Follows commands.  Speech fluent, non-dysarthric. Recent and remote memory intact.  Fund of knowledge appeared appropriate for education level.   CRANIAL NERVES:  PERRL.  VFFTC.  EOMI, no nystagmus.  Normal facial sensation to light touch.  No facial asymmetry.  Hearing intact to conversational voice.  Palate elevates in midline. Shoulder shrug symmetric.  Tongue protrusion midline.  MOTOR:  Normal bulk and tone.  Strength is full and symmetric in all 4 extremities, proximal and distal.  No drift.  No adventitious movements.   SENSATION:  Intact to light touch.   COORDINATION:  No dysmetria on finger to nose testing.  Normal RAMs.   DTRs: Symmetric.   GAIT:  Normal gait and station.     Investigations:  Labs:  No results found for: WBC, HGB,  HCT, MCV, PLT  '  Chemistry    No results found for: NA, K, CL, CO2, BUN, CREAT, GLU No results found for: CA, ALKPHOS, AST, ALT, BILITOTAL     Imaging:  MRI Brain With and Without Contrast 11/30/2016 (I personally reviewed source images and I agree with the radiologist's  interpretation as noted below):  CONCLUSION:   MR evaluation of the brain is within normal limits.    Other Studies:  N/a    Records Reviewed:  Office visit note by Dr. Lora Havens from 01/16/2017    Impression:  [name] [name] is a 57 y.o. female who presents for follow up of headaches.  Headache character is consistent with migraine.  She is currently experiencing 7-11 headache days per month.  Therefore, she would benefit from adjustments in her migraine preventive regimen.  She is unable to tolerate higher doses of gabapentin.    Recommendations:  1. Intractable migraine without aura and without status migrainosus  -Start Erenumab 70 mg SC for migraine prevention; Medication dosing schedule and potential side effects were reviewed  -Continue Naratriptan for abortive therapy  -Patient was counseled to follow the SEEDS for success in headache management, including Sleep hygiene, Exercising regularly, Eating healthy and regular meals, Drinking water, keeping a headache Diary, and Stress reduction.    2. Primary insomnia  -Continue Gabapentin 300 mg qHS    Follow up in 3 months    De Hollingshead, MD  [facility]  Board Certified in Adult Neurology by the American Board of Psychiatry and Neurology (ABPN)  Board Certified in Headache Medicine by the [employer] (UCNS)  09/20/2017

## 2017-09-20 NOTE — Patient Instructions (Signed)
Thank you for visiting the [facility].  It was a pleasure seeing you today!      Our Plan:  -Follow the SEEDS for success in headache management, including Sleep hygiene, Exercising regularly, Eating healthy and regular meals, Drinking water, keeping a headache Diary, and Stress reduction.     -Start Aimovig 70 mg once a month    -Activate copay card before you go to the pharmacy    -You can continue to use Amerge/naratriptan when you have a more severe headache    -Follow up with me in 3 months    Results:  If you have completed any imaging, labs, or other neurological testing, I will always go over your results with you at your follow up visit. If you would like to access your results prior to that, please sign up for My Chart access at home.     Patient Satisfaction Survey:  If you receive a patient satisfaction survey, I would greatly appreciate it if you would complete it.     We value your feedback! Let me know if there are things we could have done better for your office visit.    Contact Me Online:  [facility] now has a patient portal called MyChart.  Please considering signing up for MyChart.  This is the best way to communicate with me, including getting test results, requesting medication refills, or for questions and concerns that come up between our office visits.     Of note, MyChart should not be used if you are having a medical emergency -- in that case call 911.      Follow Up Appointments:  We allocate your appointment time specifically for you and your needs.  Please be courteous and call the office at least 24 hours in advance if you are unable to make it to your appointment and you need to reschedule.      New Medications and Refills:  I recommend that you call the pharmacy before picking up any medications that were called in or electronically faxed during your office visit in order to verify that your prescription was received and that it is ready for pick up.      Medication Side Effects/Reactions:   Stop taking your medication if you are concerned that you may be having an allergic reaction or if you are having significant side effects.  Please call the office ASAP to let me know that you are having problems with the medication.     For Next Visit:  Please remember to bring your medication bottles or an up-to-date medication list to your next visit.     Thank you for entrusting me with your health and for giving me the privilege of caring for you today!

## 2017-09-25 ENCOUNTER — Encounter (INDEPENDENT_AMBULATORY_CARE_PROVIDER_SITE_OTHER): Payer: Self-pay

## 2017-09-25 NOTE — Progress Notes (Signed)
Aimovig ePA submitted, key [id number]  (preferred Ajovy or Emgality)

## 2017-09-27 ENCOUNTER — Encounter (INDEPENDENT_AMBULATORY_CARE_PROVIDER_SITE_OTHER): Payer: Self-pay

## 2017-09-27 ENCOUNTER — Other Ambulatory Visit (HOSPITAL_BASED_OUTPATIENT_CLINIC_OR_DEPARTMENT_OTHER): Payer: Self-pay | Admitting: Neurology

## 2017-09-27 MED ORDER — GALCANEZUMAB-GNLM 120 MG/ML SC SOAJ
120.0000 mg | SUBCUTANEOUS | 11 refills | Status: DC
Start: 2017-09-27 — End: 2018-01-24

## 2017-09-27 MED ORDER — GALCANEZUMAB-GNLM 120 MG/ML SC SOAJ
240.0000 mg | Freq: Once | SUBCUTANEOUS | 0 refills | Status: AC
Start: 2017-09-27 — End: 2017-09-27

## 2017-09-27 NOTE — Progress Notes (Signed)
[facility] - Aimovig denial: "..plan approved Aimovig criteria covers this drug when you have tried Ajovy and Emgality and they either did not work for you or you cannot use themUnion Pacific Corporation [phone]) [phone]      Forward to MD to review

## 2017-09-29 ENCOUNTER — Telehealth (INDEPENDENT_AMBULATORY_CARE_PROVIDER_SITE_OTHER): Payer: Self-pay

## 2017-09-29 NOTE — Telephone Encounter (Signed)
Called patient and advised of same

## 2017-09-29 NOTE — Telephone Encounter (Signed)
-----   Message from Edmon Crape, MD sent at 09/27/2017 11:16 AM EDT -----  Regarding: RE: Aimovig denial  Please advise patient that I am prescribing Emgality instead.   Thanks!    ----- Message -----  From: Renato Battles, LPN  Sent: 8/41/3244   8:24 AM  To: Edmon Crape, MD  Subject: Aimovig denial                                   [facility] - Aimovig denial: "..plan approved Aimovig criteria covers this drug when you have tried Ajovy and Emgality and they either did not work for you or you cannot use themUnion Pacific Corporation [id number]) [phone]      Forward to MD to review

## 2017-11-14 ENCOUNTER — Other Ambulatory Visit (INDEPENDENT_AMBULATORY_CARE_PROVIDER_SITE_OTHER): Payer: Self-pay | Admitting: Neurology

## 2018-01-24 ENCOUNTER — Ambulatory Visit (INDEPENDENT_AMBULATORY_CARE_PROVIDER_SITE_OTHER): Payer: BC Managed Care – PPO | Admitting: Neurology

## 2018-01-24 ENCOUNTER — Encounter (INDEPENDENT_AMBULATORY_CARE_PROVIDER_SITE_OTHER): Payer: Self-pay | Admitting: Neurology

## 2018-01-24 VITALS — BP 105/69 | HR 76 | Resp 16 | Ht 60.0 in | Wt 121.0 lb

## 2018-01-24 DIAGNOSIS — G43019 Migraine without aura, intractable, without status migrainosus: Secondary | ICD-10-CM

## 2018-01-24 DIAGNOSIS — F5101 Primary insomnia: Secondary | ICD-10-CM

## 2018-01-24 DIAGNOSIS — E559 Vitamin D deficiency, unspecified: Secondary | ICD-10-CM

## 2018-01-24 MED ORDER — GALCANEZUMAB-GNLM 120 MG/ML SC SOAJ
120.0000 mg | SUBCUTANEOUS | 11 refills | Status: DC
Start: 2018-01-24 — End: 2018-11-13

## 2018-01-24 MED ORDER — VITAMIN D (ERGOCALCIFEROL) 1.25 MG (50000 UT) PO CAPS
50000.0000 [IU] | ORAL_CAPSULE | ORAL | 3 refills | Status: DC
Start: 2018-01-24 — End: 2018-09-19

## 2018-01-24 NOTE — Patient Instructions (Signed)
Thank you for visiting the [facility].  It was a pleasure seeing you today!      Our Plan:  -Follow the SEEDS for success in headache management, including Sleep hygiene, Exercising regularly, Eating healthy and regular meals, Drinking water, keeping a headache Diary, and Stress reduction.    -Start Galcanezumab/Emgality 120 mg monthly    -Follow up in 3 months    Results:  If you have completed any imaging, labs, or other neurological testing, I will always go over your results with you at your follow up visit. If you would like to access your results prior to that, please sign up for My Chart access at home.     Patient Satisfaction Survey:  If you receive a patient satisfaction survey, I would greatly appreciate it if you would complete it.     We value your feedback! Let me know if there are things we could have done better for your office visit.    Contact Me Online:  [facility] now has a patient portal called MyChart.  Please considering signing up for MyChart.  This is the best way to communicate with me, including getting test results, requesting medication refills, or for questions and concerns that come up between our office visits.     Of note, MyChart should not be used if you are having a medical emergency -- in that case call 911.      Follow Up Appointments:  We allocate your appointment time specifically for you and your needs.  Please be courteous and call the office at least 24 hours in advance if you are unable to make it to your appointment and you need to reschedule.      New Medications and Refills:  I recommend that you call the pharmacy before picking up any medications that were called in or electronically faxed during your office visit in order to verify that your prescription was received and that it is ready for pick up.     Medication Side Effects/Reactions:   Stop taking your medication if you are concerned that you may be having an allergic reaction or if you are  having significant side effects.  Please call the office ASAP to let me know that you are having problems with the medication.     For Next Visit:  Please remember to bring your medication bottles or an up-to-date medication list to your next visit.     Thank you for entrusting me with your health and for giving me the privilege of caring for you today!

## 2018-01-24 NOTE — Progress Notes (Signed)
01/24/2018    Patient ID: [name] [name]    Chief Complaint:  Headaches    History of Present Illness:  [name] [name] is a 57 y.o. female who presents for follow up of headaches.  She was previously seen by my colleague Dr. Lora Havens.  She was last seen on 09/20/2017.  At that time, I recommended a trial of Erenumab.  Due to insurance issues, she was switched to [place].     She did not ever start the Galcanezumab due to a prescribing issue.   Headache had been "stable" until last month.   She was under a lot of stress as her husband had a stroke.   In this setting, had a headache that was more severe than her usual.   Typical headache starts behind the ear.  Radiates to the bifrontal region.  Moderate in intensity.  No nausea or vomiting.  Positive photophobia and phonophobia.  Associated with more intense right-sided neck pain.     Headache History:  She has had headaches since her teenage years.  Tended to be clustered around her menses.  She is been menopausal for several years but continues to have headaches.  Typical headache starts behind the ear.  Tends to be right-sided but can sometimes be on the left.  Radiates to the bifrontal region.  Moderate in intensity.  No nausea or vomiting.  Positive photophobia and phonophobia.  Can last for several hours.  Triggered by alcohol intake.  Uses naratriptan for relief but headache can recur later the same day or the following day.  At her last visit with Dr. Theodoro Grist he recommended increasing her Gabapentin dose; she found the 600 mg dose too sedating so was continued on 300 mg nightly.  She doesn't feel this helps her headaches but it does help her sleep.   Previous preventive medicines also include magnesium and riboflavin.  She has not been on any other prescription strength preventive medications.  Currently treating headaches with Naratriptan; previous abortive medications include Sumatriptan subcu and Zolmitriptan nasal spray.  She was not able to tolerate  Sumatriptan subcutaneous due to flushing reaction.  She is unable to tolerate Zolmitriptan nasal spray due to nasal congestion irritation.  She never started [place] because she had a several month period with headache improvement.  Headache subsequently worsened in May, June, July.  Reports 7-11 headache days per month.  Also experiencing vertigo with more recent headache attacks.    Previous Preventive Medications:  Gabapentin    Previous Abortive Medications:  Sumatriptan SC  Zolmitriptan NS  Naratriptan    Review of Systems:  Constitutional: Negative for fever.   Respiratory: Negative for shortness of breath.    Cardiovascular: Negative for chest pain.   Gastrointestinal: Negative for abdominal pain.   Neurological: Negative for visual changes, alteration in awareness, speech or language problems, facial droop, focal weakness, focal numbness, limb incoordination, gait ataxia, dysphagia.  Positive migraines, neck pain, vertigo.   Psychiatric/Behavioral: Negative for mood disturbance.   All other systems reviewed and are negative except pertinent positives as noted above in HPI.     Past Medical History:  Past Medical History:   Diagnosis Date   . Migraine    Vitamin D Deficiency  Vitamin B12 Deficiency    Past Surgical History:  Past Surgical History:   Procedure Laterality Date   . TUBAL LIGATION       Allergies:  Sulfa antibiotics    Family History:  Family History   Problem Relation Age  of Onset   . Arthritis Mother    . Cancer Father    . Cancer Maternal Grandfather    No other family history related to current complaints.    Social History:  Social History     Tobacco Use   . Smoking status: Former Games developer   . Smokeless tobacco: Never Used   Substance Use Topics   . Alcohol use: Yes     Alcohol/week: 1.0 standard drinks     Types: 1 Glasses of wine per week   . Drug use: No     Medications:  Current Outpatient Medications   Medication Sig Dispense Refill   . leucovorin (WELLCOVORIN) 5 MG tablet Take 5 mg  by mouth once a week.     . methotrexate (RHEUMATREX) 2.5 MG tablet Take 2.5 mg by mouth once a week.     . naratriptan (AMERGE) 2.5 MG tablet Take one (1) tablet at onset of headache; if returns or does not resolve, may repeat after 4 hours; do not exceed five (5) mg in 24 hours. 12 tablet 11   . prednisoLONE acetate (PRED FORTE) 1 % ophthalmic suspension      . vitamin B-12 (CYANOCOBALAMIN) 1000 MCG tablet Take 1 tablet (1,000 mcg total) by mouth daily. 100 tablet 11   . vitamin D, ergocalciferol, (DRISDOL) 50000 UNIT Cap TAKE 1 CAPSULE (50,000 UNITS TOTAL) BY MOUTH ONCE A WEEK. 10 capsule 3   . Galcanezumab-gnlm (EMGALITY) 120 MG/ML Solution Auto-injector Inject 120 mg into the skin every 30 (thirty) days 120 pen 11   . magnesium oxide (MAG-OX) 400 MG tablet Take 1 tablet (400 mg total) by mouth 2 (two) times daily. 500 tablet 11   . Riboflavin 100 MG Tab Take 100 mg by mouth 2 (two) times daily. 100 each 11     No current facility-administered medications for this visit.        General Exam:  BP 105/69   Pulse 76   Resp 16   Ht 1.524 m (5')   Wt 54.9 kg (121 lb)   BMI 23.63 kg/m   Neuro Exam:    MENTAL STATUS:  Awake, alert, oriented x 3.  Attentive.  Follows commands.  Speech fluent, non-dysarthric, with normal naming.   CRANIAL NERVES:  PERRL.  VFF.  EOMI.  Face symmetric.  Hearing grossly intact.  Palate and tongue midline.   MOTOR:  Moves all extremities symmetrically antigravity.  No adventitious movements.   COORDINATION:  No dysmetria.   GAIT:  Normal gait and station.     Investigations:  Labs:  No results found for: WBC, HGB, HCT, MCV, PLT  '  Chemistry    No results found for: NA, K, CL, CO2, BUN, CREAT, GLU No results found for: CA, ALKPHOS, AST, ALT, BILITOTAL     Imaging:  MRI Brain With and Without Contrast 11/30/2016 (I personally reviewed source images and I agree with the radiologist's interpretation as noted below):  CONCLUSION:   MR evaluation of the brain is within normal  limits.    Other Studies:  N/a    Impression:  [name] [name] is a 57 y.o. female who presents for follow up of headaches.  Headache character is consistent with migraine.  Headaches are currently severe and uncontrolled.  She could benefit from a more aggressive preventive regimen.  She has already failed Gabapentin.     Recommendations:  1. Intractable migraine without aura and without status migrainosus  -Trial of Galcanezumab for abortive therapy; Medication  dosing schedule and potential side effects were reviewed.  -Continue Naratriptan for abortive therapy  -Patient was counseled to follow the SEEDS for success in headache management, including Sleep hygiene, Exercising regularly, Eating healthy and regular meals, Drinking water, keeping a headache Diary, and Stress reduction.    2. Primary insomnia  -Continue Gabapentin 300 mg qHS    3. Vitamin D Deficiency  -Vitamin D refilled    Follow up in 3 months    De Hollingshead, MD  [facility]  Board Certified in Adult Neurology by the American Board of Psychiatry and Neurology (ABPN)  Board Certified in Headache Medicine by the [employer] (UCNS)  01/24/2018

## 2018-02-07 ENCOUNTER — Encounter (INDEPENDENT_AMBULATORY_CARE_PROVIDER_SITE_OTHER): Payer: Self-pay

## 2018-02-07 NOTE — Progress Notes (Signed)
[facility] approved - Emgality eff 01/24/18-01/25/19    Patient advised of same

## 2018-04-25 ENCOUNTER — Ambulatory Visit (INDEPENDENT_AMBULATORY_CARE_PROVIDER_SITE_OTHER): Payer: BC Managed Care – PPO | Admitting: Neurology

## 2018-05-08 ENCOUNTER — Ambulatory Visit (INDEPENDENT_AMBULATORY_CARE_PROVIDER_SITE_OTHER): Payer: BC Managed Care – PPO | Admitting: Neurology

## 2018-05-08 ENCOUNTER — Encounter (INDEPENDENT_AMBULATORY_CARE_PROVIDER_SITE_OTHER): Payer: Self-pay | Admitting: Neurology

## 2018-05-08 VITALS — BP 118/81 | HR 71 | Ht 60.0 in | Wt 120.0 lb

## 2018-05-08 DIAGNOSIS — F5101 Primary insomnia: Secondary | ICD-10-CM

## 2018-05-08 DIAGNOSIS — G43019 Migraine without aura, intractable, without status migrainosus: Secondary | ICD-10-CM

## 2018-05-08 NOTE — Patient Instructions (Signed)
Thank you for visiting the [facility].  It was a pleasure seeing you today!      Our Plan:  -Follow the SEEDS for success in headache management, including Sleep hygiene, Exercising regularly, Eating healthy and regular meals, Drinking water, keeping a headache Diary, and Stress reduction.    -Continue current medsf    -Let me know if you continue to have injection site reactions with the Emgality    -Follow up in 6 months    Results:  If you have completed any imaging, labs, or other neurological  testing, I will always go over your results with you at your follow up visit.  If you would like to access your results prior to that, please sign up for My Chart access at home.     Patient Satisfaction Survey:  If you receive a patient satisfaction survey, I would greatly appreciate it if you would complete it.     We value your feedback! Let me know if there are things we could have done better for your office visit.    Contact Me Online:  [facility] now has a patient portal called MyChart.  Please considering signing up for MyChart.  This is the best way to communicate with me, including getting test results, requesting medication refills, or for questions and concerns that come up between our office visits.     Of note, MyChart should not be used if you are having a medical emergency -- in that case call 911.      Follow Up Appointments:  We allocate your appointment time specifically for you and your needs.  Please be courteous and call the office at least 24 hours in advance if you are unable to make it to your appointment and you need to reschedule.      New Medications and Refills:  I recommend that you call the pharmacy before picking up any medications that were called in or electronically faxed during your office visit in order to verify that your prescription was received and that it is ready for pick up.     Medication Side Effects/Reactions:   Stop taking your medication if you are  concerned that you may be having an allergic reaction or if you are having significant side effects.  Please call the office ASAP to let me know that you are having problems with the medication.     For Next Visit:  Please remember to bring your medication bottles or an up-to-date medication list to your next visit.     Thank you for entrusting me with your health and for giving me the privilege of caring for you today!

## 2018-05-08 NOTE — Progress Notes (Signed)
05/08/2018    Patient ID: [name] [name]    Chief Complaint:  Headaches    History of Present Illness:  [name] [name] is a 58 y.o. female who presents for follow up of headaches.  She was last seen on 01/24/2018.  At that time I recommended resumption of Galcanezumab.     Headaches have been improved since starting Galcanezumab.   Started injections in December.   Was headache free for the rest of December and all of January.   Had 5 headache days in February.   Typical headache starts behind the ear.  Radiates to the bifrontal region.  No nausea or vomiting.  Positive photophobia and phonophobia.  Headaches had been moderate in severity; now the severity is also improved and she would classify them as mild.   She has not had to use Sumatriptan.     Headache History:  She has had headaches since her teenage years.  Tended to be clustered around her menses.  She is been menopausal for several years but continues to have headaches.  Typical headache starts behind the ear.  Tends to be right-sided but can sometimes be on the left.  Radiates to the bifrontal region.  Moderate in intensity.  No nausea or vomiting.  Positive photophobia and phonophobia.  Can last for several hours.  Triggered by alcohol intake.  Uses naratriptan for relief but headache can recur later the same day or the following day.  At her last visit with Dr. Theodoro Grist he recommended increasing her Gabapentin dose; she found the 600 mg dose too sedating so was continued on 300 mg nightly.  She doesn't feel this helps her headaches but it does help her sleep.   Previous preventive medicines also include magnesium and riboflavin.  She has not been on any other prescription strength preventive medications.  Currently treating headaches with Naratriptan; previous abortive medications include Sumatriptan subcu and Zolmitriptan nasal spray.  She was not able to tolerate Sumatriptan subcutaneous due to flushing reaction.  She is unable to tolerate Zolmitriptan nasal  spray due to nasal congestion irritation.  She never started [place] because she had a several month period with headache improvement.  Headache subsequently worsened in May, June, July.  Reports 7-11 headache days per month.  Also experiencing vertigo with more recent headache attacks.    Previous Preventive Medications:  Gabapentin  Galcanezumab    Previous Abortive Medications:  Sumatriptan SC  Zolmitriptan NS  Naratriptan    Review of Systems:  Constitutional: Negative for fever.   Respiratory: Negative for shortness of breath.    Cardiovascular: Negative for chest pain.   Gastrointestinal: Negative for abdominal pain.   Neurological: Negative for visual changes, alteration in awareness, speech or language problems, facial droop, focal weakness, focal numbness, limb incoordination, gait ataxia, dysphagia.  Positive migraines, neck pain, vertigo.   Psychiatric/Behavioral: Negative for mood disturbance.   All other systems reviewed and are negative except pertinent positives as noted above in HPI.     Past Medical History:  Past Medical History:   Diagnosis Date    Migraine    Vitamin D Deficiency  Vitamin B12 Deficiency    Past Surgical History:  Past Surgical History:   Procedure Laterality Date    TUBAL LIGATION       Allergies:  Sulfa antibiotics    Family History:  Family History   Problem Relation Age of Onset    Arthritis Mother     Cancer Father     Cancer Maternal  Grandfather    No other family history related to current complaints.    Social History:  Social History     Tobacco Use    Smoking status: Former Smoker    Smokeless tobacco: Never Used   Substance Use Topics    Alcohol use: Yes     Alcohol/week: 1.0 standard drinks     Types: 1 Glasses of wine per week    Drug use: No     Medications:  Current Outpatient Medications   Medication Sig Dispense Refill    Galcanezumab-gnlm (EMGALITY) 120 MG/ML Solution Auto-injector Inject 120 mg into the skin every 30 (thirty) days For the first  month only, take two injections. 12 pen 11    leucovorin (WELLCOVORIN) 5 MG tablet Take 5 mg by mouth once a week.      magnesium oxide (MAG-OX) 400 MG tablet Take 1 tablet (400 mg total) by mouth 2 (two) times daily. 500 tablet 11    methotrexate (RHEUMATREX) 2.5 MG tablet Take 2.5 mg by mouth once a week.      naratriptan (AMERGE) 2.5 MG tablet Take one (1) tablet at onset of headache; if returns or does not resolve, may repeat after 4 hours; do not exceed five (5) mg in 24 hours. 12 tablet 11    prednisoLONE acetate (PRED FORTE) 1 % ophthalmic suspension       Riboflavin 100 MG Tab Take 100 mg by mouth 2 (two) times daily. 100 each 11    vitamin B-12 (CYANOCOBALAMIN) 1000 MCG tablet Take 1 tablet (1,000 mcg total) by mouth daily. 100 tablet 11    vitamin D, ergocalciferol, (DRISDOL) 50000 UNIT Cap Take 1 capsule (50,000 Units total) by mouth once a week 10 capsule 3     No current facility-administered medications for this visit.        General Exam:  BP 118/81 (BP Site: Left arm, Patient Position: Sitting, Cuff Size: Medium)    Pulse 71    Ht 1.524 m (5')    Wt 54.4 kg (120 lb)    BMI 23.44 kg/m   Deferred.     Investigations:  Labs:  No results found for: WBC, HGB, HCT, MCV, PLT  '  Chemistry    No results found for: NA, K, CL, CO2, BUN, CREAT, GLU No results found for: CA, ALKPHOS, AST, ALT, BILITOTAL     Imaging:  MRI Brain With and Without Contrast 11/30/2016 (I personally reviewed source images and I agree with the radiologist's interpretation as noted below):  CONCLUSION:   MR evaluation of the brain is within normal limits.    Other Studies:  N/a    Impression:  [name] [name] is a 58 y.o. female who presents for follow up of headaches.  Headache character is consistent with migraine.  Headache frequency and severity has improved since starting Galcanezumab.     Recommendations:  1. Intractable migraine without aura and without status migrainosus  -Continue Galcanezumab for preventive  therapy  -Continue Naratriptan for abortive therapy  -Patient was counseled to follow the SEEDS for success in headache management, including Sleep hygiene, Exercising regularly, Eating healthy and regular meals, Drinking water, keeping a headache Diary, and Stress reduction.    2. Primary insomnia  -Continue Gabapentin 300 mg qHS    Follow up in 6 months.    De Hollingshead, MD  [facility]  Board Certified in Adult Neurology by the American Board of Psychiatry and Neurology (ABPN)  Board Certified in Headache Medicine by  the [employer] (UCNS)  05/08/2018

## 2018-09-19 ENCOUNTER — Other Ambulatory Visit (INDEPENDENT_AMBULATORY_CARE_PROVIDER_SITE_OTHER): Payer: Self-pay | Admitting: Neurology

## 2018-09-19 DIAGNOSIS — G43019 Migraine without aura, intractable, without status migrainosus: Secondary | ICD-10-CM

## 2018-09-19 DIAGNOSIS — E559 Vitamin D deficiency, unspecified: Secondary | ICD-10-CM

## 2018-09-21 MED ORDER — VITAMIN D (ERGOCALCIFEROL) 1.25 MG (50000 UT) PO CAPS
50000.0000 [IU] | ORAL_CAPSULE | ORAL | 3 refills | Status: DC
Start: 2018-09-21 — End: 2019-06-24

## 2018-11-13 ENCOUNTER — Encounter (INDEPENDENT_AMBULATORY_CARE_PROVIDER_SITE_OTHER): Payer: Self-pay | Admitting: Neurology

## 2018-11-13 ENCOUNTER — Encounter (INDEPENDENT_AMBULATORY_CARE_PROVIDER_SITE_OTHER): Payer: Self-pay

## 2018-11-13 ENCOUNTER — Telehealth (INDEPENDENT_AMBULATORY_CARE_PROVIDER_SITE_OTHER): Payer: BC Managed Care – PPO | Admitting: Neurology

## 2018-11-13 ENCOUNTER — Telehealth (INDEPENDENT_AMBULATORY_CARE_PROVIDER_SITE_OTHER): Payer: Self-pay

## 2018-11-13 VITALS — Ht 60.0 in | Wt 120.0 lb

## 2018-11-13 DIAGNOSIS — G43719 Chronic migraine without aura, intractable, without status migrainosus: Secondary | ICD-10-CM

## 2018-11-13 DIAGNOSIS — M79606 Pain in leg, unspecified: Secondary | ICD-10-CM

## 2018-11-13 MED ORDER — GABAPENTIN 300 MG PO CAPS
300.0000 mg | ORAL_CAPSULE | Freq: Every evening | ORAL | 2 refills | Status: DC
Start: 2018-11-13 — End: 2019-02-15

## 2018-11-13 MED ORDER — NURTEC 75 MG PO TBDP
75.0000 mg | ORAL_TABLET | Freq: Once | ORAL | 2 refills | Status: DC | PRN
Start: 2018-11-13 — End: 2018-11-16

## 2018-11-13 NOTE — Telephone Encounter (Signed)
Request received and beginning investigation     Nurtec 75 mg ODT    Take 1 tablet by mouth as needed for migraines

## 2018-11-13 NOTE — Patient Instructions (Signed)
-  Follow the SEEDS for success in headache management, including Sleep hygiene, Exercising regularly, Eating healthy and regular meals, Drinking water, keeping a headache Diary, and Stress reduction.    -Resume Gabapentin 300 mg once a day at bedtime    -The next time you have a more severe headache, try Nurtec ODT (Rimegepant).  Dissolve one tab by mouth at headache onset.  Max one tab per day. Sign up for the Nurtec copay card at:  [email or web address]    -Avoid use of any rescue/abortive medication more than 3 days per week to prevent rebound headaches    -Follow up in 4-6 weeks

## 2018-11-13 NOTE — Telephone Encounter (Signed)
No PA required, may fill with [facility].     $25 for 8 tablets, extra savings available with co-pay card.

## 2018-11-13 NOTE — Progress Notes (Signed)
11/13/2018     Verbal consent has been obtained from the patient to conduct a telemedicine visit encounter to minimize exposure to COVID-19: yes.    A two-way, synchronous, real- time, audio and visual interactive communication system between the patient and myself was utilized.     Patient ID: [name] [name]    Chief Complaint:  Headaches    History of Present Illness:  [name] [name] is a 58 y.o. female who presents for follow up of headaches.  She was last seen on 05/08/2018.  At that time I recommended continuation of Galcanezumab.     Headaches had been improved since starting Galcanezumab.   Unfortunately developed injection site reaction.   She stopped the medication in March/April.   Headaches have been increasing in July and August.   4-8 migraine days per month.   Several attacks per month.   Typical headache starts behind the ear.  Radiates to the bifrontal region.  No nausea or vomiting.  Positive photophobia and phonophobia.  Naratriptan takes headaches away initially but recurs later in the day.   She has also noting worsening of her nocturnal leg pain.   Took Gabapentin a few nights ago with improvement in her leg pain and in her migraines.   Hesitant to try another injectable medication.   Interested in [employer]	[place] ODT/Rimegepant.     Headache History:  She has had headaches since her teenage years.  Tended to be clustered around her menses.  She is been menopausal for several years but continues to have headaches.  Typical headache starts behind the ear.  Tends to be right-sided but can sometimes be on the left.  Radiates to the bifrontal region.  Moderate in intensity.  No nausea or vomiting.  Positive photophobia and phonophobia.  Can last for several hours.  Triggered by alcohol intake.  Uses naratriptan for relief but headache can recur later the same day or the following day.  At her last visit with Dr. Theodoro Grist he recommended increasing her Gabapentin dose; she found the 600 mg dose too sedating so was continued  on 300 mg nightly.  She doesn't feel this helps her headaches but it does help her sleep.   Previous preventive medicines also include magnesium and riboflavin.  She has not been on any other prescription strength preventive medications.  Currently treating headaches with Naratriptan; previous abortive medications include Sumatriptan subcu and Zolmitriptan nasal spray.  She was not able to tolerate Sumatriptan subcutaneous due to flushing reaction.  She is unable to tolerate Zolmitriptan nasal spray due to nasal congestion irritation.  She never started [place] because she had a several month period with headache improvement.  Headache subsequently worsened in May, June, July.  Reports 7-11 headache days per month.  Also experiencing vertigo with more recent headache attacks.    Previous Preventive Medications:  Gabapentin  Galcanezumab - injection site reaction    Previous Abortive Medications:  Sumatriptan SC  Zolmitriptan NS  Naratriptan    Review of Systems:  Constitutional: Negative for fever.   Respiratory: Negative for shortness of breath.    Cardiovascular: Negative for chest pain.   Gastrointestinal: Negative for abdominal pain.   Neurological: Negative for visual changes, alteration in awareness, speech or language problems, facial droop, focal weakness, focal numbness, limb incoordination, gait ataxia, dysphagia.  Positive migraines, neck pain, vertigo.   Psychiatric/Behavioral: Negative for mood disturbance.   All other systems reviewed and are negative except pertinent positives as noted above in HPI.  Past Medical History:  Past Medical History:   Diagnosis Date    Migraine    Vitamin D Deficiency  Vitamin B12 Deficiency    Past Surgical History:  Past Surgical History:   Procedure Laterality Date    TUBAL LIGATION       Allergies:  Sulfa antibiotics    Family History:  Family History   Problem Relation Age of Onset    Arthritis Mother     Cancer Father     Cancer Maternal Grandfather     No other family history related to current complaints.    Social History:  Social History     Tobacco Use    Smoking status: Former Smoker    Smokeless tobacco: Never Used   Substance Use Topics    Alcohol use: Yes     Alcohol/week: 1.0 standard drinks     Types: 1 Glasses of wine per week    Drug use: No     Medications:  Current Outpatient Medications   Medication Sig Dispense Refill    leucovorin (WELLCOVORIN) 5 MG tablet Take 5 mg by mouth once a week.      magnesium oxide (MAG-OX) 400 MG tablet Take 1 tablet (400 mg total) by mouth 2 (two) times daily. 500 tablet 11    methotrexate (RHEUMATREX) 2.5 MG tablet Take 2.5 mg by mouth once a week.      naratriptan (AMERGE) 2.5 MG tablet Take one (1) tablet at onset of headache; if returns or does not resolve, may repeat after 4 hours; do not exceed five (5) mg in 24 hours. 12 tablet 11    prednisoLONE acetate (PRED FORTE) 1 % ophthalmic suspension       Riboflavin 100 MG Tab Take 100 mg by mouth 2 (two) times daily. 100 each 11    vitamin B-12 (CYANOCOBALAMIN) 1000 MCG tablet Take 1 tablet (1,000 mcg total) by mouth daily. 100 tablet 11    vitamin D, ergocalciferol, (DRISDOL) 50000 UNIT Cap Take 1 capsule (50,000 Units total) by mouth once a week 10 capsule 3     No current facility-administered medications for this visit.      General Exam:  Ht 1.524 m (5')    Wt 54.4 kg (120 lb)    BMI 23.44 kg/m   Neuro Exam:    MENTAL STATUS:  Awake, alert, oriented x 3.  Attentive.  Follows commands.  Speech fluent, non-dysarthric, with normal naming.   CRANIAL NERVES:  EOMI.  Face symmetric.  Hearing grossly intact.  Tongue midline.   MOTOR:  Moves extremities symmetrically.  No adventitious movements.   COORDINATION:  No dysmetria.   GAIT:  Deferred.      Investigations:  Labs:  No results found for: WBC, HGB, HCT, MCV, PLT  '  Chemistry    No results found for: NA, K, CL, CO2, BUN, CREAT, GLU No results found for: CA, ALKPHOS, AST, ALT, BILITOTAL      Imaging:  MRI Brain With and Without Contrast 11/30/2016 (I personally reviewed source images and I agree with the radiologist's interpretation as noted below):  CONCLUSION:   MR evaluation of the brain is within normal limits.    Other Studies:  N/a    Impression:  [name] [name] is a 58 y.o. female who presents for follow up of headaches.  Headache character is consistent with migraine.  Headache frequency and severity had improved with Galcanezumab but she had injection site reaction.  Since discontinuation of Galcanezumab, headaches have  subsequently worsened again.     Recommendations:  1. Intractable migraine without aura and without status migrainosus  -Resume Gabapentin 300 mg qHS  -Trial of Rimegepant for migraine abortive therapy; Medication dosing schedule and potential side effects were reviewed.  -Patient was counseled to follow the SEEDS for success in headache management, including Sleep hygiene, Exercising regularly, Eating healthy and regular meals, Drinking water, keeping a headache Diary, and Stress reduction.  -Patient was counseled on the risk of acute medication overuse headache and was advised to limit acute medication use to no more than 3 days per week    2. Leg pain, worsening  -Resume Gabapentin 300 mg qHS    Follow up in 4-6 weeks.     De Hollingshead, MD  [facility]  Board Certified in Adult Neurology by the American Board of Psychiatry and Neurology (ABPN)  Board Certified in Headache Medicine by the [employer] (UCNS)  11/13/2018    Over 50% of the 20 minute telemedicine visit was spent in counseling and coordination of care. Patient was counseled on the disease state, possible etiologies if applicable, any triggers or exacerbating factors, possible preventative measures, medications and their potential side effects, and behavioral modifications that may benefit the patient's condition.

## 2018-11-15 ENCOUNTER — Encounter (INDEPENDENT_AMBULATORY_CARE_PROVIDER_SITE_OTHER): Payer: Self-pay | Admitting: Neurology

## 2018-11-16 ENCOUNTER — Other Ambulatory Visit (INDEPENDENT_AMBULATORY_CARE_PROVIDER_SITE_OTHER): Payer: Self-pay

## 2018-11-16 MED ORDER — NURTEC 75 MG PO TBDP
75.0000 mg | ORAL_TABLET | Freq: Once | ORAL | 2 refills | Status: DC | PRN
Start: 2018-11-16 — End: 2019-02-07

## 2018-12-26 ENCOUNTER — Telehealth (INDEPENDENT_AMBULATORY_CARE_PROVIDER_SITE_OTHER): Payer: BLUE CROSS/BLUE SHIELD | Admitting: Neurology

## 2018-12-26 ENCOUNTER — Encounter (INDEPENDENT_AMBULATORY_CARE_PROVIDER_SITE_OTHER): Payer: Self-pay | Admitting: Neurology

## 2018-12-26 VITALS — Ht 60.0 in | Wt 120.0 lb

## 2018-12-26 DIAGNOSIS — M79605 Pain in left leg: Secondary | ICD-10-CM

## 2018-12-26 DIAGNOSIS — M79604 Pain in right leg: Secondary | ICD-10-CM

## 2018-12-26 DIAGNOSIS — G43019 Migraine without aura, intractable, without status migrainosus: Secondary | ICD-10-CM

## 2018-12-26 NOTE — Patient Instructions (Signed)
-  Follow the SEEDS for success in headache management, including Sleep hygiene, Exercising regularly, Eating healthy and regular meals, Drinking water, keeping a headache Diary, and Stress reduction.    -Continue current Gabapentin dose    -You can try combining Nurtec and Naratriptan for migraine rescue    -Avoid use of any rescue/abortive medication more than 3 days per week to prevent rebound headaches    -Follow up in 3 months

## 2018-12-26 NOTE — Progress Notes (Signed)
12/26/2018     Verbal consent has been obtained from the patient to conduct a telemedicine visit encounter to minimize exposure to COVID-19: yes.    A two-way, synchronous, real- time, audio and visual interactive communication system between the patient and myself was utilized.     Patient ID: [name] [name]    Chief Complaint:  Headaches    History of Present Illness:  [name] [name] is a 59 y.o. female who presents for follow up of headaches.  She was last seen on 11/13/2018.  At that time I recommended resumption of Gabapentin, trial of Rimegepant.     States that she is "doing better".   She's tried the Rimegepant a few times; "it works, it's not fast, but it works eventually".  She reports 7 migraine days in September but only 2 days so far this month.   Typical headache starts behind the ear.  Radiates to the bifrontal region.  No nausea or vomiting.  Positive photophobia and phonophobia.  Leg pain is improved with Gabapentin as well.   No Gabapentin side effects.     Headache History:  She has had headaches since her teenage years.  Tended to be clustered around her menses.  She is been menopausal for several years but continues to have headaches.  Typical headache starts behind the ear.  Tends to be right-sided but can sometimes be on the left.  Radiates to the bifrontal region.  Moderate in intensity.  No nausea or vomiting.  Positive photophobia and phonophobia.  Can last for several hours.  Triggered by alcohol intake.  Uses naratriptan for relief but headache can recur later the same day or the following day.  At her last visit with Dr. Theodoro Grist he recommended increasing her Gabapentin dose; she found the 600 mg dose too sedating so was continued on 300 mg nightly.  She doesn't feel this helps her headaches but it does help her sleep.   Previous preventive medicines also include magnesium and riboflavin.  She has not been on any other prescription strength preventive medications.  Currently treating headaches with  Naratriptan; previous abortive medications include Sumatriptan subcu and Zolmitriptan nasal spray.  She was not able to tolerate Sumatriptan subcutaneous due to flushing reaction.  She is unable to tolerate Zolmitriptan nasal spray due to nasal congestion irritation.  She never started [place] because she had a several month period with headache improvement.  Headache subsequently worsened in May, June, July.  Reports 7-11 headache days per month.  Also experiencing vertigo with more recent headache attacks.    Previous Preventive Medications:  Gabapentin  Galcanezumab - injection site reaction    Previous Abortive Medications:  Naratriptan  Sumatriptan SC  Zolmitriptan NS    Review of Systems:  Constitutional: Negative for fever.   Respiratory: Negative for shortness of breath.    Cardiovascular: Negative for chest pain.   Gastrointestinal: Negative for abdominal pain.   Neurological: Negative for visual changes, alteration in awareness, speech or language problems, facial droop, focal weakness, focal numbness, limb incoordination, gait ataxia, dysphagia.  Positive migraines, neck pain, vertigo.   Psychiatric/Behavioral: Negative for mood disturbance.   All other systems reviewed and are negative except pertinent positives as noted above in HPI.     Past Medical History:  Past Medical History:   Diagnosis Date    Migraine    Vitamin D Deficiency  Vitamin B12 Deficiency    Past Surgical History:  Past Surgical History:   Procedure Laterality Date  TUBAL LIGATION       Allergies:  Other and Sulfa antibiotics    Family History:  Family History   Problem Relation Age of Onset    Arthritis Mother     Cancer Father     Cancer Maternal Grandfather    No other family history related to current complaints.    Social History:  Social History     Tobacco Use    Smoking status: Former Smoker    Smokeless tobacco: Never Used   Substance Use Topics    Alcohol use: Yes     Alcohol/week: 1.0 standard drinks      Types: 1 Glasses of wine per week    Drug use: No     Medications:  Current Outpatient Medications   Medication Sig Dispense Refill    gabapentin (Neurontin) 300 MG capsule Take 1 capsule (300 mg total) by mouth nightly 30 capsule 2    leucovorin (WELLCOVORIN) 5 MG tablet Take 5 mg by mouth once a week.      magnesium oxide (MAG-OX) 400 MG tablet Take 1 tablet (400 mg total) by mouth 2 (two) times daily. 500 tablet 11    methotrexate (RHEUMATREX) 2.5 MG tablet Take 2.5 mg by mouth once a week.      prednisoLONE acetate (PRED FORTE) 1 % ophthalmic suspension       Riboflavin 100 MG Tab Take 100 mg by mouth 2 (two) times daily. 100 each 11    Rimegepant Sulfate (Nurtec) 75 MG Tablet Dispersible Take 75 mg by mouth once as needed (migraine) 8 tablet 2    vitamin B-12 (CYANOCOBALAMIN) 1000 MCG tablet Take 1 tablet (1,000 mcg total) by mouth daily. 100 tablet 11    vitamin D, ergocalciferol, (DRISDOL) 50000 UNIT Cap Take 1 capsule (50,000 Units total) by mouth once a week 10 capsule 3     No current facility-administered medications for this visit.      General Exam:  Ht 1.524 m (5')    Wt 54.4 kg (120 lb)    BMI 23.44 kg/m   Neuro Exam:    MENTAL STATUS:  Awake, alert, oriented x 3.  Attentive.  Follows commands.  Speech fluent, non-dysarthric, with normal naming.   CRANIAL NERVES:  EOMI.  Face symmetric.  Hearing grossly intact.  Tongue midline.   MOTOR:  Moves extremities symmetrically.  No adventitious movements.   COORDINATION:  No dysmetria.   GAIT:  Deferred.      Investigations:  Labs:  No results found for: WBC, HGB, HCT, MCV, PLT  '  Chemistry    No results found for: NA, K, CL, CO2, BUN, CREAT, GLU No results found for: CA, ALKPHOS, AST, ALT, BILITOTAL     Imaging:  MRI Brain With and Without Contrast 11/30/2016 (I personally reviewed source images and I agree with the radiologist's interpretation as noted below):  CONCLUSION:   MR evaluation of the brain is within normal limits.    Other  Studies:  N/a    Impression:  [name] [name] is a 58 y.o. female who presents for follow up of headaches.  Headache character is consistent with migraine.  Headache frequency and severity had improved with Galcanezumab but she had injection site reaction.  Headache frequency has stabilized with resumption of Gabapentin.     Recommendations:  1. Intractable migraine without aura and without status migrainosus  -Continue Gabapentin 300 mg qHS  -Advised patient to try combining Rimegepant and Naratriptan for abortive therapy  -Patient was  counseled to follow the SEEDS for success in headache management, including Sleep hygiene, Exercising regularly, Eating healthy and regular meals, Drinking water, keeping a headache Diary, and Stress reduction.  -Patient was counseled on the risk of acute medication overuse headache and was advised to limit acute medication use to no more than 3 days per week    2. Leg pain  -Gabapentin 300 mg qHS    Follow up in 3 months.     De Hollingshead, MD  [facility]  Board Certified in Adult Neurology by the American Board of Psychiatry and Neurology (ABPN)  Board Certified in Headache Medicine by the [employer] (UCNS)  12/26/2018    Over 50% of the 20 minute telemedicine visit was spent in counseling and coordination of care. Patient was counseled on the disease state, possible etiologies if applicable, any triggers or exacerbating factors, possible preventative measures, medications and their potential side effects, and behavioral modifications that may benefit the patient's condition.

## 2019-02-07 ENCOUNTER — Other Ambulatory Visit (INDEPENDENT_AMBULATORY_CARE_PROVIDER_SITE_OTHER): Payer: Self-pay

## 2019-02-07 MED ORDER — NURTEC 75 MG PO TBDP
75.0000 mg | ORAL_TABLET | Freq: Once | ORAL | 2 refills | Status: DC | PRN
Start: 2019-02-07 — End: 2019-02-12

## 2019-02-12 ENCOUNTER — Other Ambulatory Visit (INDEPENDENT_AMBULATORY_CARE_PROVIDER_SITE_OTHER): Payer: Self-pay

## 2019-02-12 MED ORDER — NURTEC 75 MG PO TBDP
75.0000 mg | ORAL_TABLET | Freq: Once | ORAL | 2 refills | Status: DC | PRN
Start: 2019-02-12 — End: 2019-05-03

## 2019-02-15 ENCOUNTER — Other Ambulatory Visit (INDEPENDENT_AMBULATORY_CARE_PROVIDER_SITE_OTHER): Payer: Self-pay

## 2019-02-15 MED ORDER — GABAPENTIN 300 MG PO CAPS
300.0000 mg | ORAL_CAPSULE | Freq: Every evening | ORAL | 2 refills | Status: DC
Start: 2019-02-15 — End: 2019-07-05

## 2019-05-03 ENCOUNTER — Other Ambulatory Visit (INDEPENDENT_AMBULATORY_CARE_PROVIDER_SITE_OTHER): Payer: Self-pay

## 2019-05-03 MED ORDER — NURTEC 75 MG PO TBDP
75.0000 mg | ORAL_TABLET | Freq: Once | ORAL | 2 refills | Status: DC | PRN
Start: 2019-05-03 — End: 2019-07-05

## 2019-05-31 ENCOUNTER — Other Ambulatory Visit (INDEPENDENT_AMBULATORY_CARE_PROVIDER_SITE_OTHER): Payer: Self-pay

## 2019-05-31 NOTE — Telephone Encounter (Signed)
error 

## 2019-06-24 ENCOUNTER — Other Ambulatory Visit (INDEPENDENT_AMBULATORY_CARE_PROVIDER_SITE_OTHER): Payer: Self-pay | Admitting: Neurology

## 2019-06-24 DIAGNOSIS — G43019 Migraine without aura, intractable, without status migrainosus: Secondary | ICD-10-CM

## 2019-06-24 DIAGNOSIS — E559 Vitamin D deficiency, unspecified: Secondary | ICD-10-CM

## 2019-06-25 MED ORDER — VITAMIN D (ERGOCALCIFEROL) 1.25 MG (50000 UT) PO CAPS
50000.0000 [IU] | ORAL_CAPSULE | ORAL | 3 refills | Status: DC
Start: 2019-06-25 — End: 2020-08-17

## 2019-07-05 ENCOUNTER — Encounter (INDEPENDENT_AMBULATORY_CARE_PROVIDER_SITE_OTHER): Payer: Self-pay | Admitting: Neurology

## 2019-07-05 ENCOUNTER — Encounter (INDEPENDENT_AMBULATORY_CARE_PROVIDER_SITE_OTHER): Payer: Self-pay

## 2019-07-05 ENCOUNTER — Telehealth (INDEPENDENT_AMBULATORY_CARE_PROVIDER_SITE_OTHER): Payer: BLUE CROSS/BLUE SHIELD | Admitting: Neurology

## 2019-07-05 VITALS — Ht 60.0 in | Wt 121.0 lb

## 2019-07-05 DIAGNOSIS — Z79899 Other long term (current) drug therapy: Secondary | ICD-10-CM

## 2019-07-05 DIAGNOSIS — M79605 Pain in left leg: Secondary | ICD-10-CM

## 2019-07-05 DIAGNOSIS — G43019 Migraine without aura, intractable, without status migrainosus: Secondary | ICD-10-CM

## 2019-07-05 DIAGNOSIS — M79604 Pain in right leg: Secondary | ICD-10-CM

## 2019-07-05 MED ORDER — GABAPENTIN 300 MG PO CAPS
300.0000 mg | ORAL_CAPSULE | Freq: Every evening | ORAL | 2 refills | Status: DC | PRN
Start: 2019-07-05 — End: 2020-08-17

## 2019-07-05 MED ORDER — RIZATRIPTAN BENZOATE 10 MG PO TBDP
10.0000 mg | ORAL_TABLET | ORAL | 11 refills | Status: DC | PRN
Start: 2019-07-05 — End: 2020-08-17

## 2019-07-05 MED ORDER — NURTEC 75 MG PO TBDP
75.0000 mg | ORAL_TABLET | Freq: Once | ORAL | 11 refills | Status: DC | PRN
Start: 2019-07-05 — End: 2020-08-17

## 2019-07-05 NOTE — Patient Instructions (Signed)
-  Follow the SEEDS for success in headache management, including Sleep hygiene, Exercising regularly, Eating healthy and regular meals, Drinking water, keeping a headache Diary, and Stress reduction.    -Resume taking Gabapentin, but instead of nightly you can take it as needed    -Try taking the Nurtec and Maxalt/Rizatriptan simultaneously; this may work faster in terms of providing pain relief    -Avoid use of any rescue/abortive medication more than 3 days per week to prevent rebound headaches    -Follow up with me in 6 months

## 2019-07-05 NOTE — Progress Notes (Signed)
07/05/2019     Verbal consent has been obtained from the patient to conduct a telemedicine visit encounter to minimize exposure to COVID-19: yes.    A two-way, synchronous, real- time, audio and visual interactive communication system between the patient and myself was utilized.     Patient ID: [name] [name]    Chief Complaint:    Chief Complaint   Patient presents with    Migraine     f/u     History of Present Illness:  [name] [name] is a 59 y.o. female who presents for follow up of headaches.  She was last seen on 12/26/2018.  At that time I recommended continuation of Gabapentin, Rimegepant, Naratriptan.     States she had 7 migraine days last month.   Typical headache starts behind the ear.  Radiates to the bifrontal region.  No nausea or vomiting.  Positive photophobia and phonophobia.  She likes that Rimegepant is a dissolve tab but it doesn't work quickly.   She sometimes has to take additional Naratriptan.     Headache History:  She has had headaches since her teenage years.  Tended to be clustered around her menses.  She is been menopausal for several years but continues to have headaches.  Typical headache starts behind the ear.  Tends to be right-sided but can sometimes be on the left.  Radiates to the bifrontal region.  Moderate in intensity.  No nausea or vomiting.  Positive photophobia and phonophobia.  Can last for several hours.  Triggered by alcohol intake.  Uses naratriptan for relief but headache can recur later the same day or the following day.  At her last visit with Dr. Theodoro Grist he recommended increasing her Gabapentin dose; she found the 600 mg dose too sedating so was continued on 300 mg nightly.  She doesn't feel this helps her headaches but it does help her sleep.   Previous preventive medicines also include magnesium and riboflavin.  She has not been on any other prescription strength preventive medications.  Currently treating headaches with Naratriptan; previous abortive medications include  Sumatriptan subcu and Zolmitriptan nasal spray.  She was not able to tolerate Sumatriptan subcutaneous due to flushing reaction.  She is unable to tolerate Zolmitriptan nasal spray due to nasal congestion irritation.  She never started [place] because she had a several month period with headache improvement.  Headache subsequently worsened in May, June, July.  Reports 7-11 headache days per month.  Also experiencing vertigo with more recent headache attacks.    Previous Preventive Medications:  Gabapentin  Galcanezumab - injection site reaction    Previous Abortive Medications:  Naratriptan  Sumatriptan SC  Zolmitriptan NS    Review of Systems:  Constitutional: Negative for fever.   Respiratory: Negative for shortness of breath.    Cardiovascular: Negative for chest pain.   Gastrointestinal: Negative for abdominal pain.   Neurological: Negative for visual changes, alteration in awareness, speech or language problems, facial droop, focal weakness, focal numbness, limb incoordination, gait ataxia, dysphagia.  Positive migraines, neck pain, vertigo.   Psychiatric/Behavioral: Negative for mood disturbance.   All other systems reviewed and are negative except pertinent positives as noted above in HPI.     Past Medical History:  Past Medical History:   Diagnosis Date    Migraine    Vitamin D Deficiency  Vitamin B12 Deficiency    Past Surgical History:  Past Surgical History:   Procedure Laterality Date    TUBAL LIGATION  Allergies:  Other and Sulfa antibiotics    Family History:  Family History   Problem Relation Age of Onset    Arthritis Mother     Cancer Father     Cancer Maternal Grandfather    No other family history related to current complaints.    Social History:  Social History     Tobacco Use    Smoking status: Former Smoker    Smokeless tobacco: Never Used   Substance Use Topics    Alcohol use: Yes     Alcohol/week: 1.0 standard drinks     Types: 1 Glasses of wine per week    Drug use: No      Medications:  Current Outpatient Medications   Medication Sig Dispense Refill    BIOTIN PO Take by mouth      leucovorin (WELLCOVORIN) 5 MG tablet Take 5 mg by mouth once a week.      Loratadine (CLARITIN PO) Take by mouth      methotrexate (RHEUMATREX) 2.5 MG tablet Take 2.5 mg by mouth once a week.      Restasis 0.05 % ophthalmic emulsion INSTILL 1 DROP INTO BOTH EYES TWICE A DAY      Rimegepant Sulfate (Nurtec) 75 MG Tablet Dispersible Take 75 mg by mouth once as needed (migraine) 8 tablet 2    vitamin B-12 (CYANOCOBALAMIN) 1000 MCG tablet Take 1 tablet (1,000 mcg total) by mouth daily. 100 tablet 11    vitamin D, ergocalciferol, (DRISDOL) 50000 UNIT Cap Take 1 capsule (50,000 Units total) by mouth once a week 10 capsule 3     No current facility-administered medications for this visit.     General Exam:  Ht 1.524 m (5') Comment: stated   Wt 54.9 kg (121 lb) Comment: stated   BMI 23.63 kg/m   Neuro Exam:    MENTAL STATUS:  Awake, alert, oriented x 3.  Attentive.  Follows commands.  Speech fluent, non-dysarthric, with normal naming.   CRANIAL NERVES:  EOMI.  Face symmetric.  Hearing grossly intact.  Tongue midline.   MOTOR:  Moves extremities symmetrically.  No adventitious movements.   COORDINATION:  No dysmetria.   GAIT:  Deferred.      Investigations:  Labs:  No results found for: WBC, HGB, HCT, MCV, PLT  '  Chemistry    No results found for: NA, K, CL, CO2, BUN, CREAT, GLU No results found for: CA, ALKPHOS, AST, ALT, BILITOTAL     Imaging:  MRI Brain With and Without Contrast 11/30/2016 (I personally reviewed source images and I agree with the radiologist's interpretation as noted below):  CONCLUSION:   MR evaluation of the brain is within normal limits.    Other Studies:  N/a    Impression:  [name] [name] is a 59 y.o. female who presents for follow up of headaches.  Headache character is consistent with migraine.  Headache frequency and severity had improved with Galcanezumab but she had injection  site reaction.  Headache frequency has stabilized with resumption of Gabapentin.     Recommendations:  1. Intractable migraine without aura and without status migrainosus  2. Medication management  -Continue Gabapentin 300 mg; change to as needed dosing  -Trial of Rimegepant + Rizatriptan combo for acute therapy  -Patient was counseled to follow the SEEDS for success in headache management, including Sleep hygiene, Exercising regularly, Eating healthy and regular meals, Drinking water, keeping a headache Diary, and Stress reduction.  -Patient was counseled on the risk of acute  medication overuse headache and was advised to limit acute medication use to no more than 3 days per week    3. Leg pain  -Gabapentin 300 mg qHS prn    Follow up in 6 months.     Adonis Huguenin, MD  [facility]  Board Certified in Adult Neurology by the American Board of Psychiatry and Neurology (ABPN)  Board Certified in Headache Medicine by the [employer] (UCNS)  07/05/2019

## 2020-08-15 ENCOUNTER — Other Ambulatory Visit (INDEPENDENT_AMBULATORY_CARE_PROVIDER_SITE_OTHER): Payer: Self-pay | Admitting: Neurology

## 2020-08-17 ENCOUNTER — Encounter (INDEPENDENT_AMBULATORY_CARE_PROVIDER_SITE_OTHER): Payer: Self-pay | Admitting: Neurology

## 2020-08-17 ENCOUNTER — Ambulatory Visit (INDEPENDENT_AMBULATORY_CARE_PROVIDER_SITE_OTHER): Payer: BLUE CROSS/BLUE SHIELD | Admitting: Neurology

## 2020-08-17 ENCOUNTER — Telehealth: Payer: Self-pay

## 2020-08-17 VITALS — BP 113/76 | HR 82 | Ht 60.0 in | Wt 123.0 lb

## 2020-08-17 DIAGNOSIS — G43019 Migraine without aura, intractable, without status migrainosus: Secondary | ICD-10-CM

## 2020-08-17 DIAGNOSIS — Z79899 Other long term (current) drug therapy: Secondary | ICD-10-CM

## 2020-08-17 MED ORDER — NURTEC 75 MG PO TBDP
75.0000 mg | ORAL_TABLET | Freq: Once | ORAL | 11 refills | Status: DC | PRN
Start: 2020-08-17 — End: 2021-09-15

## 2020-08-17 NOTE — Patient Instructions (Signed)
-  Follow the SEEDS for success in headache management, including Sleep hygiene, Exercising regularly, Eating healthy and regular meals, Drinking water, keeping a headache Diary, and Stress reduction.    -Continue Nurtec up to once per day as needed for migraine treatment    -Avoid use of any "triptans" or over-the-counter rescue/abortive medications (e.g. Advil/Ibuprofen, Aleve/Naproxen, Tylenol/Acetaminophen, Excedrin/Aspirin-Acetaminophen-Caffeine) more than 3 days per week to prevent rebound headaches    -Follow up in 1 year

## 2020-08-17 NOTE — Telephone Encounter (Signed)
Nurtec 75mg  OD Tablets-     Received refill request via [facility]. Ran test claim via [employer] for 30/30. Received $0.00 copay. Pt able to fill with [facility]. Okay to [employer].

## 2020-08-17 NOTE — Progress Notes (Signed)
08/17/2020     Patient ID: [name]    Chief Complaint:    Chief Complaint   Patient presents with   . Migraine   . Medication Refill     History of Present Illness:  [name] [name] is a 60 y.o. female who presents for follow up of headaches.  She was last seen on 07/05/2019.  At that time I recommended continuation of Gabapentin, Rimegepant, Naratriptan.     Stopped Gabapentin and Rizatriptan due to side effects.   She had been taking Rimegepant as needed.   States it "works okay" but it's not fast.   She is averaging 8-9 migraine days per month.   Worse during seasonal changes.     Headache History:  She has had headaches since her teenage years.  Tended to be clustered around her menses.  She is been menopausal for several years but continues to have headaches.  Typical headache starts behind the ear.  Tends to be right-sided but can sometimes be on the left.  Radiates to the bifrontal region.  Moderate in intensity.  No nausea or vomiting.  Positive photophobia and phonophobia.  Can last for several hours.  Triggered by alcohol intake.  Uses naratriptan for relief but headache can recur later the same day or the following day.  At her last visit with Dr. Theodoro Grist he recommended increasing her Gabapentin dose; she found the 600 mg dose too sedating so was continued on 300 mg nightly.  She doesn't feel this helps her headaches but it does help her sleep.   Previous preventive medicines also include magnesium and riboflavin.  She has not been on any other prescription strength preventive medications.  Currently treating headaches with Naratriptan; previous abortive medications include Sumatriptan subcu and Zolmitriptan nasal spray.  She was not able to tolerate Sumatriptan subcutaneous due to flushing reaction.  She is unable to tolerate Zolmitriptan nasal spray due to nasal congestion irritation.  She never started [place] because she had a several month period with headache improvement.  Headache subsequently  worsened in May, June, July.  Reports 7-11 headache days per month.  Also experiencing vertigo with more recent headache attacks.    Previous Preventive Medications:  CoEnzymeQ10  Gabapentin  Galcanezumab - injection site reaction  Magnesium    Previous Abortive Medications:  Naratriptan  Rimegepant  Rizatriptan  Sumatriptan SC  Zolmitriptan NS    Past Medical History:  Past Medical History:   Diagnosis Date   . B12 deficiency    . Migraine    . Vitamin D deficiency      Past Surgical History:  Past Surgical History:   Procedure Laterality Date   . TUBAL LIGATION       Allergies:  Other and Sulfa antibiotics    Family History:  Family History   Problem Relation Age of Onset   . Arthritis Mother    . Cancer Father    . Cancer Maternal Grandfather    No other family history related to current complaints.    Social History:  Social History     Tobacco Use   . Smoking status: Former Games developer   . Smokeless tobacco: Never Used   Substance Use Topics   . Alcohol use: Yes     Alcohol/week: 1.0 standard drink     Types: 1 Glasses of wine per week   . Drug use: No     Medications:  Current Outpatient Medications   Medication Sig Dispense Refill   . alendronate (  FOSAMAX) 70 MG tablet TAKE 1 (ONE) TABLET BY MOUTH ONCE A WEEK     . Ca Cit Malate-Cholecalciferol (CALCIUM CITRATE MALATE-VIT D PO) Take by mouth     . estradiol (ESTRACE) 0.1 MG/GM vaginal cream PLEASE SEE ATTACHED FOR DETAILED DIRECTIONS     . leucovorin (WELLCOVORIN) 5 MG tablet Take 5 mg by mouth once a week.     . Loratadine (CLARITIN PO) Take by mouth     . methotrexate (RHEUMATREX) 2.5 MG tablet Take 2.5 mg by mouth once a week.     Marland Kitchen Restasis 0.05 % ophthalmic emulsion INSTILL 1 DROP INTO BOTH EYES TWICE A DAY     . Rimegepant Sulfate (Nurtec) 75 MG Tablet Dispersible Take 75 mg by mouth once as needed (migraine) 8 tablet 11     No current facility-administered medications for this visit.     General Exam:  BP 113/76 (BP Site: Left arm, Patient Position:  Sitting, Cuff Size: Small)   Pulse 82   Ht 1.524 m (5')   Wt 55.8 kg (123 lb)   BMI 24.02 kg/m   Neuro Exam:    MENTAL STATUS:  Awake, alert, oriented.  Attentive.  Follows commands.  Speech fluent, non-dysarthric, with normal naming.   CRANIAL NERVES:  EOMI.  Face symmetric above mask.  Hearing grossly intact.  Shoulders symmetric.   MOTOR:  Moves all extremities symmetrically antigravity.  No adventitious movements.   COORDINATION:  No dysmetria.   GAIT:  Normal gait and station.     Investigations:  Labs:  No results found for: WBC, HGB, HCT, MCV, PLT  '  Chemistry    No results found for: NA, K, CL, CO2, BUN, CREAT, GLU No results found for: CA, ALKPHOS, AST, ALT, BILITOTAL     Imaging:  MRI Brain With and Without Contrast 11/30/2016:  CONCLUSION:   MR evaluation of the brain is within normal limits.    Other Studies:  N/a    Impression:  [name] [name] is a 60 y.o. female who presents for follow up of headaches.  Headache character is consistent with migraine. Incompletely controlled.     Recommendations:  1. Intractable migraine without aura and without status migrainosus  2. Medication management  -Discussed option of adding a daily oral preventive medication or a once-per-month injection for improved migraine prevention; she was not interested at this time  -Continue Rimegepant as needed for acute relief; Medication dosing schedule and potential side effects were reviewed.  -Patient was counseled to follow the SEEDS for success in headache management, including Sleep hygiene, Exercising regularly, Eating healthy and regular meals, Drinking water, keeping a headache Diary, and Stress reduction.  -Patient was counseled on the risk of acute medication overuse headache and was advised to limit acute medication use to no more than 3 days per week    Follow up in 1 year.     Adonis Huguenin, MD  [facility]  Board Certified in Adult Neurology by the American Board of Psychiatry and Neurology (ABPN)  Board  Certified in Headache Medicine by the [employer] (UCNS)  08/17/2020

## 2021-09-15 ENCOUNTER — Encounter: Payer: Self-pay | Admitting: Neurology

## 2021-09-15 ENCOUNTER — Other Ambulatory Visit (INDEPENDENT_AMBULATORY_CARE_PROVIDER_SITE_OTHER): Payer: Self-pay | Admitting: Neurology

## 2021-10-08 ENCOUNTER — Ambulatory Visit: Payer: BLUE CROSS/BLUE SHIELD | Attending: Neurology | Admitting: Neurology

## 2021-10-08 ENCOUNTER — Encounter: Payer: Self-pay | Admitting: Neurology

## 2021-10-08 VITALS — BP 126/87 | HR 77 | Ht 60.0 in | Wt 125.0 lb

## 2021-10-08 DIAGNOSIS — G43019 Migraine without aura, intractable, without status migrainosus: Secondary | ICD-10-CM | POA: Insufficient documentation

## 2021-10-08 MED ORDER — RIZATRIPTAN BENZOATE 10 MG PO TBDP
10.0000 mg | ORAL_TABLET | Freq: Two times a day (BID) | ORAL | 11 refills | Status: DC | PRN
Start: 2021-10-08 — End: 2022-10-14

## 2021-10-08 MED ORDER — NURTEC 75 MG PO TBDP
75.0000 mg | ORAL_TABLET | Freq: Once | ORAL | 11 refills | Status: DC | PRN
Start: 2021-10-08 — End: 2022-10-14

## 2021-10-08 NOTE — Progress Notes (Signed)
10/08/2021     Patient ID: [name]    Chief Complaint:    Chief Complaint   Patient presents with    Migraine     History of Present Illness:  [name] [name] is a 61 y.o. female who presents for follow up of headaches.  She was last seen on 08/17/2020.      Headaches have been more frequent as she has been coordinating care for her mother who has leukemia.    Has been transporting her to all of her visits and communicating with other family members.   Rimegepant is effective for acute relief.   No side effects.   No new concerns.     Interval History 08/17/2020:  Stopped Gabapentin and Rizatriptan due to side effects.   She had been taking Rimegepant as needed.   States it "works okay" but it's not fast.   She is averaging 8-9 migraine days per month.   Worse during seasonal changes.     Headache History:  She has had headaches since her teenage years.  Tended to be clustered around her menses.  She is been menopausal for several years but continues to have headaches.  Typical headache starts behind the ear.  Tends to be right-sided but can sometimes be on the left.  Radiates to the bifrontal region.  Moderate in intensity.  No nausea or vomiting.  Positive photophobia and phonophobia.  Can last for several hours.  Triggered by alcohol intake.  Uses naratriptan for relief but headache can recur later the same day or the following day.  At her last visit with Dr. Theodoro Grist he recommended increasing her Gabapentin dose; she found the 600 mg dose too sedating so was continued on 300 mg nightly.  She doesn't feel this helps her headaches but it does help her sleep.   Previous preventive medicines also include magnesium and riboflavin.  She has not been on any other prescription strength preventive medications.  Currently treating headaches with Naratriptan; previous abortive medications include Sumatriptan subcu and Zolmitriptan nasal spray.  She was not able to tolerate Sumatriptan subcutaneous due to flushing reaction.  She  is unable to tolerate Zolmitriptan nasal spray due to nasal congestion irritation.  She never started [place] because she had a several month period with headache improvement.  Headache subsequently worsened in May, June, July.  Reports 7-11 headache days per month.  Also experiencing vertigo with more recent headache attacks.    Previous Preventive Medications:  CoEnzymeQ10  Gabapentin  Galcanezumab - injection site reaction  Magnesium    Previous Abortive Medications:  Naratriptan  Rimegepant  Rizatriptan  Sumatriptan SC  Zolmitriptan NS    Past Medical History:  Past Medical History:   Diagnosis Date    B12 deficiency     Migraine     Vitamin D deficiency      Past Surgical History:  Past Surgical History:   Procedure Laterality Date    TUBAL LIGATION       Allergies:  Other and Sulfa antibiotics    Family History:  Family History   Problem Relation Age of Onset    Arthritis Mother     Cancer Father     Cancer Maternal Grandfather    No other family history related to current complaints.    Social History:  Social History     Tobacco Use    Smoking status: Former    Smokeless tobacco: Never   Substance Use Topics    Alcohol use: Yes  Alcohol/week: 1.0 standard drink of alcohol     Types: 1 Glasses of wine per week    Drug use: No     Medications:  Current Outpatient Medications   Medication Sig Dispense Refill    alendronate (FOSAMAX) 70 MG tablet TAKE 1 (ONE) TABLET BY MOUTH ONCE A WEEK      Ca Cit Malate-Cholecalciferol (CALCIUM CITRATE MALATE-VIT D PO) Take by mouth      estradiol (ESTRACE) 0.1 MG/GM vaginal cream PLEASE SEE ATTACHED FOR DETAILED DIRECTIONS      leucovorin (WELLCOVORIN) 5 MG tablet Take 5 mg by mouth once a week.      Loratadine (CLARITIN PO) Take by mouth      methotrexate (RHEUMATREX) 2.5 MG tablet Take 2.5 mg by mouth once a week.      Restasis 0.05 % ophthalmic emulsion INSTILL 1 DROP INTO BOTH EYES TWICE A DAY      Rimegepant Sulfate (Nurtec) 75 MG Tablet Dispersible TAKE 1 TABLET  (75 MG TOTAL) BY MOUTH ONCE AS NEEDED (MIGRAINE) 16 tablet 0     No current facility-administered medications for this visit.     General Exam:  BP 126/87   Pulse 77   Ht 1.524 m (5')   Wt 56.7 kg (125 lb)   BMI 24.41 kg/m   Neuro Exam:    MENTAL STATUS:  Awake, alert, oriented.  Attentive.  Follows commands.  Speech fluent, non-dysarthric, with normal naming.   CRANIAL NERVES:  EOMI.  Face symmetric.  Hearing grossly intact.  Shoulders symmetric.   MOTOR:  Moves all extremities symmetrically antigravity.  No adventitious movements.   COORDINATION:  No dysmetria.   GAIT:  Normal gait and station.     Investigations:  Labs:  No results found for: "WBC", "HGB", "HCT", "MCV", "PLT"  '  Chemistry    No results found for: "NA", "K", "CL", "CO2", "BUN", "CREAT", "GLU" No results found for: "CA", "ALKPHOS", "AST", "ALT", "BILITOTAL"     Imaging:  MRI Brain With and Without Contrast 11/30/2016:  CONCLUSION:   MR evaluation of the brain is within normal limits.     Other Studies:  N/a    Impression:  [name] [name] is a 61 y.o. female who presents for follow up of headaches.  Headache character is consistent with migraine.     Recommendations:  1. Intractable migraine without aura and without status migrainosus  -Discussed option changing medication regimen, either adding preventive medication or switching to Ubrogepant or Zavegepant; she was not interested at this time  -Continue Rimegepant as needed for acute relief; Medication dosing schedule and potential side effects were reviewed.  -Patient was counseled to follow the SEEDS for success in headache management, including Sleep hygiene, Exercising regularly, Eating healthy and regular meals, Drinking water, keeping a headache Diary, and Stress reduction.  -Patient was counseled on the risk of acute medication overuse headache and was advised to limit acute medication use to no more than 3 days per week    Follow up in 1 year.     Adonis Huguenin, MD  [facility]  Board  Certified in Adult Neurology by the American Board of Psychiatry and Neurology (ABPN)  Board Certified in Headache Medicine by the [employer] (UCNS)  10/08/2021

## 2021-10-08 NOTE — Patient Instructions (Addendum)
-  Follow the SEEDS for success in headache management, including Sleep hygiene, Exercising regularly, Eating healthy and regular meals, Drinking water, keeping a headache Diary, and Stress reduction.     -No med changes    -Avoid use of any "triptans" or over-the-counter rescue/abortive medications (e.g. Advil/Ibuprofen, Aleve/Naproxen, Tylenol/Acetaminophen, Excedrin/Aspirin-Acetaminophen-Caffeine) more than 3 days per week to prevent rebound headaches    -Follow up in 1 year

## 2021-11-05 ENCOUNTER — Ambulatory Visit: Payer: BLUE CROSS/BLUE SHIELD | Admitting: Neurology

## 2022-10-14 ENCOUNTER — Encounter: Payer: Self-pay | Admitting: Neurology

## 2022-10-14 ENCOUNTER — Ambulatory Visit
Payer: BLUE CROSS/BLUE SHIELD | Attending: Student in an Organized Health Care Education/Training Program | Admitting: Neurology

## 2022-10-14 VITALS — BP 166/77 | HR 84 | Temp 97.9°F | Resp 19 | Ht 60.0 in | Wt 128.2 lb

## 2022-10-14 DIAGNOSIS — G43019 Migraine without aura, intractable, without status migrainosus: Secondary | ICD-10-CM

## 2022-10-14 DIAGNOSIS — M792 Neuralgia and neuritis, unspecified: Secondary | ICD-10-CM

## 2022-10-14 MED ORDER — GABAPENTIN 300 MG PO CAPS
300.0000 mg | ORAL_CAPSULE | Freq: Every evening | ORAL | 2 refills | Status: AC
Start: 2022-10-14 — End: ?

## 2022-10-14 MED ORDER — RIZATRIPTAN BENZOATE 10 MG PO TBDP
10.0000 mg | ORAL_TABLET | Freq: Two times a day (BID) | ORAL | 11 refills | Status: AC | PRN
Start: 2022-10-14 — End: ?

## 2022-10-14 MED ORDER — NURTEC 75 MG PO TBDP
75.0000 mg | ORAL_TABLET | Freq: Once | ORAL | 11 refills | Status: DC | PRN
Start: 2022-10-14 — End: 2023-10-13

## 2022-10-14 NOTE — Progress Notes (Signed)
10/14/2022     Patient ID: [name] [name]    Chief Complaint:    Chief Complaint   Patient presents with    Follow-up     History of Present Illness:  [name] [name] is a 62 y.o. female who presents for follow up of headaches.  She was last seen on 10/08/2021.  At that time I recommended continuation of Rimegepant.     Mother has passed.   Now helping other family members with social/housing stressors.   She is having 8-10 migraine days per months.   Overall intensity is not as severe.     Interval History 10/08/2021:  Headaches have been more frequent as she has been coordinating care for her mother who has leukemia.    Has been transporting her to all of her visits and communicating with other family members.   Rimegepant is effective for acute relief.   No side effects.   No new concerns.     Interval History 08/17/2020:  Stopped Gabapentin and Rizatriptan due to side effects.   She had been taking Rimegepant as needed.   States it "works okay" but it's not fast.   She is averaging 8-9 migraine days per month.   Worse during seasonal changes.     Headache History:  She has had headaches since her teenage years.  Tended to be clustered around her menses.  She is been menopausal for several years but continues to have headaches.  Typical headache starts behind the ear.  Tends to be right-sided but can sometimes be on the left.  Radiates to the bifrontal region.  Moderate in intensity.  No nausea or vomiting.  Positive photophobia and phonophobia.  Can last for several hours.  Triggered by alcohol intake.  Uses naratriptan for relief but headache can recur later the same day or the following day.  At her last visit with Dr. Theodoro Grist he recommended increasing her Gabapentin dose; she found the 600 mg dose too sedating so was continued on 300 mg nightly.  She doesn't feel this helps her headaches but it does help her sleep.   Previous preventive medicines also include magnesium and riboflavin.  She has not been on any other  prescription strength preventive medications.  Currently treating headaches with Naratriptan; previous abortive medications include Sumatriptan subcu and Zolmitriptan nasal spray.  She was not able to tolerate Sumatriptan subcutaneous due to flushing reaction.  She is unable to tolerate Zolmitriptan nasal spray due to nasal congestion irritation.  She never started [place] because she had a several month period with headache improvement.  Headache subsequently worsened in May, June, July.  Reports 7-11 headache days per month.  Also experiencing vertigo with more recent headache attacks.    Previous Preventive Medications:  CoEnzymeQ10  Gabapentin  Galcanezumab - injection site reaction  Magnesium    Previous Abortive Medications:  Naratriptan  Rimegepant  Rizatriptan  Sumatriptan SC  Zolmitriptan NS    Past Medical History:  Past Medical History:   Diagnosis Date    B12 deficiency     Migraine     Vitamin D deficiency      Past Surgical History:  Past Surgical History:   Procedure Laterality Date    TUBAL LIGATION       Allergies:  Other and Sulfa antibiotics    Family History:  Family History   Problem Relation Age of Onset    Arthritis Mother     Cancer Father     Cancer Maternal Grandfather  No other family history related to current complaints.    Social History:  Social History     Tobacco Use    Smoking status: Former    Smokeless tobacco: Never   Substance Use Topics    Alcohol use: Yes     Alcohol/week: 1.0 standard drink of alcohol     Types: 1 Glasses of wine per week    Drug use: No     Medications:  Current Outpatient Medications   Medication Sig Dispense Refill    alendronate (FOSAMAX) 70 MG tablet TAKE 1 (ONE) TABLET BY MOUTH ONCE A WEEK      Ca Cit Malate-Cholecalciferol (CALCIUM CITRATE MALATE-VIT D PO) Take by mouth      estradiol (ESTRACE) 0.1 MG/GM vaginal cream PLEASE SEE ATTACHED FOR DETAILED DIRECTIONS      leucovorin (WELLCOVORIN) 5 MG tablet Take 1 tablet (5 mg) by mouth once a week       Loratadine (CLARITIN PO) Take by mouth      methotrexate (RHEUMATREX) 2.5 MG tablet Take 1 tablet (2.5 mg) by mouth once a week      Restasis 0.05 % ophthalmic emulsion INSTILL 1 DROP INTO BOTH EYES TWICE A DAY      Rimegepant Sulfate (Nurtec) 75 MG Tablet Dispersible Take 1 tablet (75 mg) by mouth once as needed (migraine) 16 tablet 11    rizatriptan (MAXALT-MLT) 10 MG disintegrating tablet Take 1 tablet (10 mg) by mouth 2 (two) times daily as needed for Migraine .  Wait at least 2 hours prior to taking second dose.  Max of 2 doses in 24 hours. 12 tablet 11    Xyzal Allergy 24HR 5 MG tablet        No current facility-administered medications for this visit.     General Exam:  BP 166/77 (BP Site: Left arm, Patient Position: Sitting, Cuff Size: Medium)   Pulse 84   Temp 97.9 F (36.6 C) (Oral)   Resp 19   Ht 1.524 m (5')   Wt 58.2 kg (128 lb 3.2 oz)   SpO2 98%   BMI 25.04 kg/m   Neuro Exam:    MENTAL STATUS:  Awake, alert, oriented.  Attentive.  Follows commands.  Speech fluent, non-dysarthric, with normal naming.   CRANIAL NERVES:  EOMI.  Face symmetric.  Hearing grossly intact.  Shoulders symmetric.   MOTOR:  Moves all extremities symmetrically antigravity.  No adventitious movements.   COORDINATION:  No dysmetria.   GAIT:  Normal gait and station.     Investigations:  Labs:  No results found for: "WBC", "HGB", "HCT", "MCV", "PLT"  '  Chemistry    No results found for: "NA", "K", "CL", "CO2", "BUN", "CREAT", "GLU" No results found for: "CA", "ALKPHOS", "AST", "ALT", "BILITOTAL"     Imaging:  MRI Brain With and Without Contrast 11/30/2016:  CONCLUSION:   MR evaluation of the brain is within normal limits.     Other Studies:  N/a    Impression:  [name] [name] is a 62 y.o. female who presents for follow up of headaches.  Headache character is consistent with migraine.     Recommendations:  1. Intractable migraine without aura and without status migrainosus  -We discussed option of daily medication; she would  like to continue treating attacks as they come for now  -Continue Rimegepant as needed for acute relief; Medication dosing schedule and potential side effects were reviewed.  -Rizatriptan as back up  -Patient was counseled to follow the SEEDS  for success in headache management, including Sleep hygiene, Exercising regularly, Eating healthy and regular meals, Drinking water, keeping a headache Diary, and Stress reduction.  -Patient was counseled on the risk of acute medication overuse headache and was advised to limit acute medication use to no more than 3 days per week    2. Nerve pain  -Gabapentin 300 mg qHS prn for nerve pain    Follow up in 1 year for continued longitudinal management of chronic disorder.     Adonis Huguenin, MD  [facility]  Board Certified in Adult Neurology by the American Board of Psychiatry and Neurology (ABPN)  Board Certified in Headache Medicine by the [employer] (UCNS)  10/14/2022

## 2022-10-14 NOTE — Patient Instructions (Signed)
-  Follow the SEEDS for success in headache management, including Sleep hygiene, Exercising regularly, Eating healthy and regular meals, Drinking water, keeping a headache Diary, and Stress reduction.    -I sent in refills for Gabapentin, Rizatriptan, and Nurtec    -Avoid use of any "triptans" or over-the-counter rescue/abortive medications (e.g. Advil/Ibuprofen, Aleve/Naproxen, Tylenol/Acetaminophen, Excedrin/Aspirin-Acetaminophen-Caffeine) more than 3 days per week to prevent rebound headaches    -Follow up in 1 year

## 2023-10-13 ENCOUNTER — Encounter: Payer: Self-pay | Admitting: Neurology

## 2023-10-13 ENCOUNTER — Other Ambulatory Visit: Payer: Self-pay

## 2023-10-13 ENCOUNTER — Ambulatory Visit: Payer: BLUE CROSS/BLUE SHIELD | Attending: Neurology | Admitting: Neurology

## 2023-10-13 VITALS — BP 136/90 | HR 87 | Temp 98.0°F | Resp 16 | Ht 60.0 in | Wt 123.0 lb

## 2023-10-13 DIAGNOSIS — G43019 Migraine without aura, intractable, without status migrainosus: Secondary | ICD-10-CM | POA: Insufficient documentation

## 2023-10-13 DIAGNOSIS — M792 Neuralgia and neuritis, unspecified: Secondary | ICD-10-CM | POA: Insufficient documentation

## 2023-10-13 MED ORDER — UBROGEPANT 100 MG PO TABS
100.0000 mg | ORAL_TABLET | Freq: Two times a day (BID) | ORAL | 11 refills | Status: AC | PRN
Start: 2023-10-13 — End: ?

## 2023-10-13 NOTE — Progress Notes (Signed)
 10/13/2023     Patient ID: [name] [name]    Chief Complaint:    Chief Complaint   Patient presents with    Migraine    Annual Exam     History of Present Illness:  [name] [name] is a 63 y.o. female who presents for follow up of headaches.  She was last seen on 10/08/2021.  At that time I recommended continuation of Rimegepant and Gabapentin .     States she has been feeling well.   Had frozen shoulder towards end of last year and need several months of PT.   Feels like her headaches are stable.   3-8 days of headache per month.   She is using Rimegepant for acute relief with good effect.     Interval History November 13, 2022:  Mother has passed.   Now helping other family members with social/housing stressors.   She is having 8-10 migraine days per months.   Overall intensity is not as severe.     Interval History 10/08/2021:  Headaches have been more frequent as she has been coordinating care for her mother who has leukemia.    Has been transporting her to all of her visits and communicating with other family members.   Rimegepant is effective for acute relief.   No side effects.   No new concerns.     Interval History 08/17/2020:  Stopped Gabapentin  and Rizatriptan  due to side effects.   She had been taking Rimegepant as needed.   States it works okay but it's not fast.   She is averaging 8-9 migraine days per month.   Worse during seasonal changes.    Interval History 07/05/2019:  States she had 7 migraine days last month.   Typical headache starts behind the ear.  Radiates to the bifrontal region.  No nausea or vomiting.  Positive photophobia and phonophobia.  She likes that Rimegepant is a dissolve tab but it doesn't work quickly.   She sometimes has to take additional Naratriptan .     Interval History 12/26/2018:  States that she is doing better.   She's tried the Rimegepant a few times; it works, it's not fast, but it works eventually.  She reports 7 migraine days in September but only 2 days so far this month.    Typical headache starts behind the ear.  Radiates to the bifrontal region.  No nausea or vomiting.  Positive photophobia and phonophobia.  Leg pain is improved with Gabapentin  as well.   No Gabapentin  side effects.    Interval History 11/13/2018:  Headaches have been improved since starting Galcanezumab .   Started injections in December.   Was headache free for the rest of December and all of January.   Had 5 headache days in February.   Typical headache starts behind the ear.  Radiates to the bifrontal region.  No nausea or vomiting.  Positive photophobia and phonophobia.  Headaches had been moderate in severity; now the severity is also improved and she would classify them as mild.   She has not had to use Sumatriptan.     Interval History 06/04/2018:  Headaches have been improved since starting Galcanezumab .   Started injections in December.   Was headache free for the rest of December and all of January.   Had 5 headache days in February.   Typical headache starts behind the ear.  Radiates to the bifrontal region.  No nausea or vomiting.  Positive photophobia and phonophobia.  Headaches had been moderate in severity; now the  severity is also improved and she would classify them as mild.   She has not had to use Sumatriptan.     Interval History 01/24/2018:  She did not ever start the Galcanezumab  due to a prescribing issue.   Headache had been stable until last month.   She was under a lot of stress as her husband had a stroke.   In this setting, had a headache that was more severe than her usual.   Typical headache starts behind the ear.  Radiates to the bifrontal region.  Moderate in intensity.  No nausea or vomiting.  Positive photophobia and phonophobia.  Associated with more intense right-sided neck pain.     Headache History Retained from Initial 09/20/2017 Visit:  She has had headaches since her teenage years.  Tended to be clustered around her menses.  She is been menopausal for several years but  continues to have headaches.  Typical headache starts behind the ear.  Tends to be right-sided but can sometimes be on the left.  Radiates to the bifrontal region.  Moderate in intensity.  No nausea or vomiting.  Positive photophobia and phonophobia.  Can last for several hours.  Triggered by alcohol intake.  Uses naratriptan  for relief but headache can recur later the same day or the following day.  At her last visit with Dr. Deatrice he recommended increasing her Gabapentin  dose; she found the 600 mg dose too sedating so was continued on 300 mg nightly.  She doesn't feel this helps her headaches but it does help her sleep.   Previous preventive medicines also include magnesium  and riboflavin .  She has not been on any other prescription strength preventive medications.  Currently treating headaches with Naratriptan ; previous abortive medications include Sumatriptan subcu and Zolmitriptan  nasal spray.  She was not able to tolerate Sumatriptan subcutaneous due to flushing reaction.  She is unable to tolerate Zolmitriptan  nasal spray due to nasal congestion irritation.  She never started Fremanezumab because she had a several month period with headache improvement.  Headache subsequently worsened in May, June, July.  Reports 7-11 headache days per month.  Also experiencing vertigo with more recent headache attacks.    Previous Preventive Medications:  CoEnzymeQ10  Gabapentin   Galcanezumab  - injection site reaction  Magnesium     Previous Abortive Medications:  Naratriptan   Rimegepant  Rizatriptan   Sumatriptan SC  Zolmitriptan  NS    Past Medical History:  Past Medical History:   Diagnosis Date    B12 deficiency     Migraine     Vitamin D  deficiency      Past Surgical History:  Past Surgical History:   Procedure Laterality Date    TUBAL LIGATION       Allergies:  Other and Sulfa antibiotics    Family History:  Family History   Problem Relation Age of Onset    Arthritis Mother     Cancer Father     Cancer Maternal Grandfather     No other family history related to current complaints.    Social History:  Social History     Tobacco Use    Smoking status: Former    Smokeless tobacco: Never   Advertising account planner    Vaping status: Never Used   Substance Use Topics    Alcohol use: Yes     Alcohol/week: 1.0 standard drink of alcohol     Types: 1 Glasses of wine per week    Drug use: No     Medications:  Current Outpatient  Medications   Medication Sig Dispense Refill    estradiol (ESTRACE) 0.1 MG/GM vaginal cream PLEASE SEE ATTACHED FOR DETAILED DIRECTIONS      gabapentin  (Neurontin ) 300 MG capsule Take 1 capsule (300 mg) by mouth nightly (Patient taking differently: Take 1 capsule (300 mg) by mouth as needed) 30 capsule 2    leucovorin (WELLCOVORIN) 5 MG tablet Take 1 tablet (5 mg) by mouth once a week      Loratadine (CLARITIN PO) Take by mouth (Patient taking differently: Take by mouth as needed)      methotrexate (RHEUMATREX) 2.5 MG tablet Take 1 tablet (2.5 mg) by mouth once a week      Restasis 0.05 % ophthalmic emulsion INSTILL 1 DROP INTO BOTH EYES TWICE A DAY      Rimegepant Sulfate (Nurtec) 75 MG Tablet Dispersible Take 1 tablet (75 mg) by mouth once as needed (migraine) 16 tablet 11    rizatriptan  (MAXALT -MLT) 10 MG disintegrating tablet Take 1 tablet (10 mg) by mouth 2 (two) times daily as needed for Migraine .  Wait at least 2 hours prior to taking second dose.  Max of 2 doses in 24 hours. 12 tablet 11    saccharomyces boulardii (FLORASTOR) 250 MG capsule Take 1 capsule (250 mg) by mouth 2 (two) times daily      tretinoin (RETIN-A) 0.025 % cream  (Patient taking differently: every other day)      vitamin D , cholecalciferol, 10 MCG (400 UNIT) tablet Take 12.5 tablets (125 mcg) by mouth once daily      Xyzal Allergy 24HR 5 MG tablet       alendronate (FOSAMAX) 70 MG tablet TAKE 1 (ONE) TABLET BY MOUTH ONCE A WEEK (Patient not taking: Reported on 10/13/2023)      Ca Cit Malate-Cholecalciferol (CALCIUM CITRATE MALATE-VIT D PO) Take by mouth  (Patient not taking: Reported on 10/13/2023)       No current facility-administered medications for this visit.     General Exam:  BP 136/90 (BP Site: Left arm, Patient Position: Sitting, Cuff Size: Medium)   Pulse 87   Temp 98 F (36.7 C) (Oral)   Resp 16   Ht 1.524 m (5')   Wt 55.8 kg (123 lb)   SpO2 98%   BMI 24.02 kg/m   Neuro Exam:    MENTAL STATUS:  Awake, alert, oriented.  Attentive.  Follows commands.  Speech fluent, non-dysarthric, with normal naming.   CRANIAL NERVES:  EOMI.  Face symmetric.  Hearing grossly intact.  Shoulders symmetric.   MOTOR:  Moves all extremities symmetrically antigravity.  No adventitious movements.   COORDINATION:  No dysmetria.   GAIT:  Normal gait and station.     Investigations:  Labs:  No results found for: WBC, HGB, HCT, MCV, PLT  '  Chemistry    No results found for: NA, K, CL, CO2, BUN, CREAT, GLU No results found for: CA, ALKPHOS, AST, ALT, BILITOTAL     Imaging:  MRI Brain With and Without Contrast 11/30/2016:  CONCLUSION:   MR evaluation of the brain is within normal limits.     Other Studies:  N/a    Impression:  [name] [name] is a 63 y.o. female who presents for follow up of headaches.  Headache character is consistent with migraine.     Recommendations:  1. Intractable migraine without aura and without status migrainosus  -We discussed option of daily medication; she would like to continue treating attacks as they come for now  -  Rimegepant is costly on current drug plan; will check Ubrogepant  coverage  -Rizatriptan  as back up  -Patient was counseled to follow the SEEDS for success in headache management, including Sleep hygiene, Exercising regularly, Eating healthy and regular meals, Drinking water, keeping a headache Diary, and Stress reduction.  -Patient was counseled on the risk of acute medication overuse headache and was advised to limit acute medication use to no more than 3 days per week    2. Nerve pain  -Gabapentin  300 mg  qHS prn for nerve pain    Follow up in 1 year for continued longitudinal management of chronic disorder.     Leita Palms, MD  [facility]  Board Certified in Adult Neurology by the American Board of Psychiatry and Neurology (ABPN)  Board Certified in Headache Medicine by the [employer] ([employer])  10/13/2023

## 2023-10-13 NOTE — Patient Instructions (Addendum)
-  Follow the SEEDS for success in headache management, including Sleep hygiene, Exercising regularly, Eating healthy and regular meals, Drinking water, keeping a headache Diary, and Stress reduction.    -Try Ubrelvy  (also called Ubrogepant ) for headache relief/rescue.  Take one 100 mg pill at headache onset.  You may repeat once after 2 hours. Max of 2 doses in 24 hours. Ubrelvy  does not cause rebound headaches so it is okay to take multiple days in a row. The most common side effect is nausea.  Please notify your provider if other side effects occur.     -Our pharmacy team will update you     -Avoid use of any triptans or over-the-counter rescue/abortive medications (e.g. Advil/Ibuprofen, Aleve/Naproxen, Tylenol/Acetaminophen, Excedrin/Aspirin-Acetaminophen-Caffeine) more than 3 days per week to prevent rebound headaches

## 2023-10-16 ENCOUNTER — Other Ambulatory Visit: Payer: Self-pay

## 2023-10-30 ENCOUNTER — Telehealth: Payer: Self-pay | Admitting: Neurology

## 2023-10-30 ENCOUNTER — Other Ambulatory Visit: Payer: Self-pay

## 2023-10-30 NOTE — Telephone Encounter (Addendum)
 Copied from CRM #[id number]. Topic: Clinical Support - Medical Question  >> Oct 30, 2023 12:38 PM Caren G wrote:  Norelle from [facility] [employer] pre-authorization department called on patients behalf requesting for Dr. Thalia or clinical staff to call regarding additional medical questions. I confirmed a call back number for any additional questions.    [phone]

## 2023-11-03 ENCOUNTER — Other Ambulatory Visit: Payer: Self-pay

## 2024-10-14 ENCOUNTER — Ambulatory Visit: Admitting: Neurology
